# Patient Record
Sex: Female | Born: 1960 | Race: White | Hispanic: No | Marital: Married | Smoking: Never smoker
Health system: Southern US, Community
[De-identification: ages and names within clinical notes are randomized; demographics above are authoritative.]

## PROBLEM LIST (undated history)

## (undated) DIAGNOSIS — I639 Cerebral infarction, unspecified: Secondary | ICD-10-CM

## (undated) DIAGNOSIS — G35 Multiple sclerosis: Secondary | ICD-10-CM

## (undated) HISTORY — PX: TUBAL LIGATION: SHX77

## (undated) HISTORY — PX: TONSILLECTOMY AND ADENOIDECTOMY: SHX28

## (undated) HISTORY — PX: OTHER SURGICAL HISTORY: SHX169

## (undated) HISTORY — PX: CHOLECYSTECTOMY: SHX55

## (undated) HISTORY — PX: PAIN PUMP IMPLANTATION: SHX330

---

## 2007-08-14 ENCOUNTER — Encounter (HOSPITAL_COMMUNITY): Admission: RE | Admit: 2007-08-14 | Discharge: 2007-09-13 | Payer: Self-pay | Admitting: Specialist

## 2007-08-28 ENCOUNTER — Ambulatory Visit (HOSPITAL_COMMUNITY): Admission: RE | Admit: 2007-08-28 | Discharge: 2007-08-28 | Payer: Self-pay | Admitting: Family Medicine

## 2007-09-15 ENCOUNTER — Encounter (HOSPITAL_COMMUNITY): Admission: RE | Admit: 2007-09-15 | Discharge: 2007-10-15 | Payer: Self-pay | Admitting: Specialist

## 2007-10-16 ENCOUNTER — Encounter (HOSPITAL_COMMUNITY): Admission: RE | Admit: 2007-10-16 | Discharge: 2007-11-15 | Payer: Self-pay | Admitting: Specialist

## 2007-11-17 ENCOUNTER — Encounter (HOSPITAL_COMMUNITY): Admission: RE | Admit: 2007-11-17 | Discharge: 2007-12-17 | Payer: Self-pay | Admitting: Specialist

## 2007-12-18 ENCOUNTER — Encounter (HOSPITAL_COMMUNITY): Admission: RE | Admit: 2007-12-18 | Discharge: 2008-01-17 | Payer: Self-pay | Admitting: Specialist

## 2009-05-08 ENCOUNTER — Encounter (HOSPITAL_COMMUNITY): Admission: RE | Admit: 2009-05-08 | Discharge: 2009-06-07 | Payer: Self-pay | Admitting: Specialist

## 2009-06-10 ENCOUNTER — Encounter (HOSPITAL_COMMUNITY): Admission: RE | Admit: 2009-06-10 | Discharge: 2009-07-09 | Payer: Self-pay | Admitting: Specialist

## 2010-07-16 ENCOUNTER — Encounter (HOSPITAL_COMMUNITY)
Admission: RE | Admit: 2010-07-16 | Discharge: 2010-08-11 | Payer: Self-pay | Source: Home / Self Care | Attending: Specialist | Admitting: Specialist

## 2010-08-12 ENCOUNTER — Ambulatory Visit (HOSPITAL_COMMUNITY): Payer: Self-pay | Admitting: Occupational Therapy

## 2010-08-12 ENCOUNTER — Ambulatory Visit (HOSPITAL_COMMUNITY)
Admission: RE | Admit: 2010-08-12 | Discharge: 2010-08-12 | Disposition: A | Discharge status: Home / Self Care | Payer: Medicare Other | Source: Ambulatory Visit | Attending: *Deleted | Admitting: *Deleted

## 2010-08-12 DIAGNOSIS — R262 Difficulty in walking, not elsewhere classified: Secondary | ICD-10-CM | POA: Insufficient documentation

## 2010-08-12 DIAGNOSIS — IMO0001 Reserved for inherently not codable concepts without codable children: Secondary | ICD-10-CM | POA: Insufficient documentation

## 2010-08-12 DIAGNOSIS — M6281 Muscle weakness (generalized): Secondary | ICD-10-CM | POA: Insufficient documentation

## 2010-08-14 ENCOUNTER — Ambulatory Visit (HOSPITAL_COMMUNITY)
Admission: RE | Admit: 2010-08-14 | Discharge: 2010-08-14 | Disposition: A | Discharge status: Home / Self Care | Payer: Medicare Other | Source: Ambulatory Visit | Attending: Specialist | Admitting: Specialist

## 2010-08-14 ENCOUNTER — Ambulatory Visit (HOSPITAL_COMMUNITY): Payer: Self-pay | Admitting: Specialist

## 2010-08-14 ENCOUNTER — Ambulatory Visit (HOSPITAL_COMMUNITY): Payer: Self-pay

## 2010-08-14 DIAGNOSIS — IMO0001 Reserved for inherently not codable concepts without codable children: Secondary | ICD-10-CM | POA: Insufficient documentation

## 2010-08-14 DIAGNOSIS — R262 Difficulty in walking, not elsewhere classified: Secondary | ICD-10-CM | POA: Insufficient documentation

## 2010-08-14 DIAGNOSIS — M6281 Muscle weakness (generalized): Secondary | ICD-10-CM | POA: Insufficient documentation

## 2010-08-17 ENCOUNTER — Ambulatory Visit (HOSPITAL_COMMUNITY): Payer: Self-pay | Admitting: Occupational Therapy

## 2010-08-17 ENCOUNTER — Ambulatory Visit (HOSPITAL_COMMUNITY)
Admission: RE | Admit: 2010-08-17 | Discharge: 2010-08-17 | Disposition: A | Discharge status: Home / Self Care | Payer: Medicare Other | Source: Ambulatory Visit | Attending: *Deleted | Admitting: *Deleted

## 2010-08-17 ENCOUNTER — Ambulatory Visit (HOSPITAL_COMMUNITY): Payer: Medicare Other | Admitting: Physical Therapy

## 2010-08-17 DIAGNOSIS — R209 Unspecified disturbances of skin sensation: Secondary | ICD-10-CM | POA: Insufficient documentation

## 2010-08-17 DIAGNOSIS — R262 Difficulty in walking, not elsewhere classified: Secondary | ICD-10-CM | POA: Insufficient documentation

## 2010-08-17 DIAGNOSIS — IMO0001 Reserved for inherently not codable concepts without codable children: Secondary | ICD-10-CM | POA: Insufficient documentation

## 2010-08-17 DIAGNOSIS — M6281 Muscle weakness (generalized): Secondary | ICD-10-CM | POA: Insufficient documentation

## 2010-08-19 ENCOUNTER — Ambulatory Visit (HOSPITAL_COMMUNITY): Payer: Self-pay | Admitting: Physical Therapy

## 2010-08-20 ENCOUNTER — Ambulatory Visit (HOSPITAL_COMMUNITY)
Admission: RE | Admit: 2010-08-20 | Discharge: 2010-08-20 | Disposition: A | Discharge status: Home / Self Care | Payer: Medicare Other | Source: Ambulatory Visit | Attending: Specialist | Admitting: Specialist

## 2010-08-20 DIAGNOSIS — M6281 Muscle weakness (generalized): Secondary | ICD-10-CM | POA: Insufficient documentation

## 2010-08-20 DIAGNOSIS — R209 Unspecified disturbances of skin sensation: Secondary | ICD-10-CM | POA: Insufficient documentation

## 2010-08-20 DIAGNOSIS — R262 Difficulty in walking, not elsewhere classified: Secondary | ICD-10-CM | POA: Insufficient documentation

## 2010-08-20 DIAGNOSIS — IMO0001 Reserved for inherently not codable concepts without codable children: Secondary | ICD-10-CM | POA: Insufficient documentation

## 2010-08-21 ENCOUNTER — Ambulatory Visit (HOSPITAL_COMMUNITY): Payer: Self-pay

## 2010-08-21 ENCOUNTER — Ambulatory Visit (HOSPITAL_COMMUNITY): Payer: Self-pay | Admitting: Occupational Therapy

## 2010-08-25 ENCOUNTER — Ambulatory Visit (HOSPITAL_COMMUNITY): Payer: Medicare Other | Admitting: Physical Therapy

## 2010-08-25 ENCOUNTER — Ambulatory Visit (HOSPITAL_COMMUNITY)
Admission: RE | Admit: 2010-08-25 | Discharge: 2010-08-25 | Disposition: A | Discharge status: Home / Self Care | Payer: Medicare Other | Source: Ambulatory Visit | Attending: Specialist | Admitting: Specialist

## 2010-08-25 DIAGNOSIS — IMO0001 Reserved for inherently not codable concepts without codable children: Secondary | ICD-10-CM | POA: Insufficient documentation

## 2010-08-25 DIAGNOSIS — R262 Difficulty in walking, not elsewhere classified: Secondary | ICD-10-CM | POA: Insufficient documentation

## 2010-08-25 DIAGNOSIS — M6281 Muscle weakness (generalized): Secondary | ICD-10-CM | POA: Insufficient documentation

## 2010-08-27 ENCOUNTER — Ambulatory Visit (HOSPITAL_COMMUNITY)
Admission: RE | Admit: 2010-08-27 | Discharge: 2010-08-27 | Disposition: A | Discharge status: Home / Self Care | Payer: Medicare Other | Source: Ambulatory Visit

## 2010-08-28 ENCOUNTER — Ambulatory Visit (HOSPITAL_COMMUNITY)
Admission: RE | Admit: 2010-08-28 | Discharge: 2010-08-28 | Disposition: A | Discharge status: Home / Self Care | Payer: Medicare Other | Source: Ambulatory Visit

## 2010-08-31 ENCOUNTER — Ambulatory Visit (HOSPITAL_COMMUNITY)
Admission: RE | Admit: 2010-08-31 | Discharge: 2010-08-31 | Disposition: A | Discharge status: Home / Self Care | Payer: Medicare Other | Source: Ambulatory Visit | Attending: *Deleted | Admitting: *Deleted

## 2010-08-31 ENCOUNTER — Ambulatory Visit (HOSPITAL_COMMUNITY): Payer: Medicare Other | Admitting: Physical Therapy

## 2010-09-02 ENCOUNTER — Ambulatory Visit (HOSPITAL_COMMUNITY)
Admission: RE | Admit: 2010-09-02 | Discharge: 2010-09-02 | Disposition: A | Discharge status: Home / Self Care | Payer: Medicare Other | Source: Ambulatory Visit | Attending: *Deleted | Admitting: *Deleted

## 2010-09-04 ENCOUNTER — Ambulatory Visit (HOSPITAL_COMMUNITY)
Admission: RE | Admit: 2010-09-04 | Discharge: 2010-09-04 | Disposition: A | Discharge status: Home / Self Care | Payer: Medicare Other | Source: Ambulatory Visit

## 2010-09-07 ENCOUNTER — Ambulatory Visit (HOSPITAL_COMMUNITY): Payer: Medicare Other | Admitting: Physical Therapy

## 2010-09-09 ENCOUNTER — Ambulatory Visit (HOSPITAL_COMMUNITY)
Admission: RE | Admit: 2010-09-09 | Discharge: 2010-09-09 | Disposition: A | Discharge status: Home / Self Care | Payer: Medicare Other | Source: Ambulatory Visit | Attending: *Deleted | Admitting: *Deleted

## 2010-09-11 ENCOUNTER — Ambulatory Visit (HOSPITAL_COMMUNITY)
Admission: RE | Admit: 2010-09-11 | Discharge: 2010-09-11 | Disposition: A | Discharge status: Home / Self Care | Payer: Medicare Other | Source: Ambulatory Visit | Attending: Specialist | Admitting: Specialist

## 2010-09-11 DIAGNOSIS — M6281 Muscle weakness (generalized): Secondary | ICD-10-CM | POA: Insufficient documentation

## 2010-09-11 DIAGNOSIS — R262 Difficulty in walking, not elsewhere classified: Secondary | ICD-10-CM | POA: Insufficient documentation

## 2010-09-11 DIAGNOSIS — IMO0001 Reserved for inherently not codable concepts without codable children: Secondary | ICD-10-CM | POA: Insufficient documentation

## 2010-09-14 ENCOUNTER — Ambulatory Visit (HOSPITAL_COMMUNITY)
Admission: RE | Admit: 2010-09-14 | Discharge: 2010-09-14 | Disposition: A | Discharge status: Home / Self Care | Payer: Medicare Other | Source: Ambulatory Visit | Attending: Specialist | Admitting: Specialist

## 2010-09-14 DIAGNOSIS — IMO0001 Reserved for inherently not codable concepts without codable children: Secondary | ICD-10-CM | POA: Insufficient documentation

## 2010-09-14 DIAGNOSIS — M6281 Muscle weakness (generalized): Secondary | ICD-10-CM | POA: Insufficient documentation

## 2010-09-14 DIAGNOSIS — R262 Difficulty in walking, not elsewhere classified: Secondary | ICD-10-CM | POA: Insufficient documentation

## 2010-09-16 ENCOUNTER — Ambulatory Visit (HOSPITAL_COMMUNITY): Payer: Medicare Other | Admitting: Physical Therapy

## 2010-09-18 ENCOUNTER — Ambulatory Visit (HOSPITAL_COMMUNITY)
Admission: RE | Admit: 2010-09-18 | Discharge: 2010-09-18 | Disposition: A | Discharge status: Home / Self Care | Payer: Medicare Other | Source: Ambulatory Visit

## 2010-09-21 ENCOUNTER — Ambulatory Visit (HOSPITAL_COMMUNITY)
Admission: RE | Admit: 2010-09-21 | Discharge: 2010-09-21 | Disposition: A | Discharge status: Home / Self Care | Payer: Medicare Other | Source: Ambulatory Visit | Attending: *Deleted | Admitting: *Deleted

## 2010-09-23 ENCOUNTER — Ambulatory Visit (HOSPITAL_COMMUNITY)
Admission: RE | Admit: 2010-09-23 | Discharge: 2010-09-23 | Disposition: A | Discharge status: Home / Self Care | Payer: Medicare Other | Source: Ambulatory Visit | Attending: *Deleted | Admitting: *Deleted

## 2010-09-24 ENCOUNTER — Ambulatory Visit (HOSPITAL_COMMUNITY)
Admission: RE | Admit: 2010-09-24 | Discharge: 2010-09-24 | Disposition: A | Discharge status: Home / Self Care | Payer: Medicare Other | Source: Ambulatory Visit | Attending: *Deleted | Admitting: *Deleted

## 2010-09-28 ENCOUNTER — Ambulatory Visit (HOSPITAL_COMMUNITY)
Admission: RE | Admit: 2010-09-28 | Discharge: 2010-09-28 | Disposition: A | Discharge status: Home / Self Care | Payer: Medicare Other | Source: Ambulatory Visit | Attending: *Deleted | Admitting: *Deleted

## 2010-09-30 ENCOUNTER — Ambulatory Visit (HOSPITAL_COMMUNITY)
Admission: RE | Admit: 2010-09-30 | Discharge: 2010-09-30 | Disposition: A | Discharge status: Home / Self Care | Payer: Medicare Other | Source: Ambulatory Visit | Attending: *Deleted | Admitting: *Deleted

## 2010-10-02 ENCOUNTER — Ambulatory Visit (HOSPITAL_COMMUNITY)
Admission: RE | Admit: 2010-10-02 | Discharge: 2010-10-02 | Disposition: A | Discharge status: Home / Self Care | Payer: Medicare Other | Source: Ambulatory Visit

## 2010-10-05 ENCOUNTER — Ambulatory Visit (HOSPITAL_COMMUNITY)
Admission: RE | Admit: 2010-10-05 | Discharge: 2010-10-05 | Disposition: A | Discharge status: Home / Self Care | Payer: Medicare Other | Source: Ambulatory Visit | Attending: *Deleted | Admitting: *Deleted

## 2010-10-07 ENCOUNTER — Ambulatory Visit (HOSPITAL_COMMUNITY)
Admission: RE | Admit: 2010-10-07 | Discharge: 2010-10-07 | Disposition: A | Discharge status: Home / Self Care | Payer: Medicare Other | Source: Ambulatory Visit | Attending: *Deleted | Admitting: *Deleted

## 2010-10-09 ENCOUNTER — Ambulatory Visit (HOSPITAL_COMMUNITY): Payer: Medicare Other | Admitting: Physical Therapy

## 2010-10-13 ENCOUNTER — Ambulatory Visit (HOSPITAL_COMMUNITY)
Admission: RE | Admit: 2010-10-13 | Discharge: 2010-10-13 | Disposition: A | Discharge status: Home / Self Care | Payer: Medicare Other | Source: Ambulatory Visit | Attending: Specialist | Admitting: Specialist

## 2010-10-15 ENCOUNTER — Ambulatory Visit (HOSPITAL_COMMUNITY)
Admission: RE | Admit: 2010-10-15 | Discharge: 2010-10-15 | Disposition: A | Discharge status: Home / Self Care | Payer: Medicare Other | Source: Ambulatory Visit | Attending: Specialist | Admitting: Specialist

## 2010-10-15 DIAGNOSIS — R262 Difficulty in walking, not elsewhere classified: Secondary | ICD-10-CM | POA: Insufficient documentation

## 2010-10-15 DIAGNOSIS — IMO0001 Reserved for inherently not codable concepts without codable children: Secondary | ICD-10-CM | POA: Insufficient documentation

## 2010-10-15 DIAGNOSIS — M6281 Muscle weakness (generalized): Secondary | ICD-10-CM | POA: Insufficient documentation

## 2010-10-20 ENCOUNTER — Ambulatory Visit (HOSPITAL_COMMUNITY)
Admission: RE | Admit: 2010-10-20 | Discharge: 2010-10-20 | Disposition: A | Discharge status: Home / Self Care | Payer: Medicare Other | Source: Ambulatory Visit | Attending: *Deleted | Admitting: *Deleted

## 2010-10-22 ENCOUNTER — Ambulatory Visit (HOSPITAL_COMMUNITY)
Admission: RE | Admit: 2010-10-22 | Discharge: 2010-10-22 | Disposition: A | Discharge status: Home / Self Care | Payer: Medicare Other | Source: Ambulatory Visit | Attending: *Deleted | Admitting: *Deleted

## 2010-10-27 ENCOUNTER — Ambulatory Visit (HOSPITAL_COMMUNITY): Payer: Medicare Other

## 2010-10-29 ENCOUNTER — Ambulatory Visit (HOSPITAL_COMMUNITY)
Admission: RE | Admit: 2010-10-29 | Discharge: 2010-10-29 | Disposition: A | Discharge status: Home / Self Care | Payer: Medicare Other | Source: Ambulatory Visit | Attending: *Deleted | Admitting: *Deleted

## 2010-11-03 ENCOUNTER — Ambulatory Visit (HOSPITAL_COMMUNITY)
Admission: RE | Admit: 2010-11-03 | Discharge: 2010-11-03 | Disposition: A | Discharge status: Home / Self Care | Payer: Medicare Other | Source: Ambulatory Visit | Attending: *Deleted | Admitting: *Deleted

## 2010-11-05 ENCOUNTER — Ambulatory Visit (HOSPITAL_COMMUNITY)
Admission: RE | Admit: 2010-11-05 | Discharge: 2010-11-05 | Disposition: A | Discharge status: Home / Self Care | Payer: Medicare Other | Source: Ambulatory Visit | Attending: *Deleted | Admitting: *Deleted

## 2011-01-01 ENCOUNTER — Ambulatory Visit (HOSPITAL_BASED_OUTPATIENT_CLINIC_OR_DEPARTMENT_OTHER)
Admission: RE | Admit: 2011-01-01 | Discharge: 2011-01-01 | Disposition: A | Discharge status: Home / Self Care | Payer: Medicare Other | Source: Ambulatory Visit | Attending: Orthopedic Surgery | Admitting: Orthopedic Surgery

## 2011-01-01 DIAGNOSIS — Q6600 Congenital talipes equinovarus, unspecified foot: Secondary | ICD-10-CM | POA: Insufficient documentation

## 2011-01-01 DIAGNOSIS — L97509 Non-pressure chronic ulcer of other part of unspecified foot with unspecified severity: Secondary | ICD-10-CM | POA: Insufficient documentation

## 2011-01-01 DIAGNOSIS — M6240 Contracture of muscle, unspecified site: Secondary | ICD-10-CM | POA: Insufficient documentation

## 2011-01-12 NOTE — Op Note (Signed)
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  PROCEDURES: 1. Percutaneous tendo Achilles lengthening. 2. Z-lengthening of posterior tibial tendon. 3. Deep transfer of the tibialis anterior to the lateral cuneiform. 4. Intraoperative interpretation of fluoroscopic imaging.  SURGEON:  Toni Arthurs, MD  ANESTHESIA:  General.  INTRAVENOUS FLUIDS:  See anesthesia record.  ESTIMATED BLOOD LOSS:  Minimal.  TOURNIQUET TIME:  One hour and 19 minutes at 250 mmHg.  COMPLICATIONS:  None apparent.  DISPOSITION:  Extubated, awake, and stable to recovery.  INDICATIONS FOR PROCEDURE:  The patient is a 50 year old woman with past medical history significant for multiple sclerosis and a stroke affecting the left lower extremity.  The left foot has become troublesome due to an equinovarus foot deformity.  This has led to recurrent fifth metatarsal head ulcers.  She presents now for correction of this foot deformity in an effort to gain a plantigrade foot that is resistant to ulceration and is shoeable for transfers.  She understands the risks and benefits of this procedure as well as the alternative treatment options.  Specifically, she understands the risks of bleeding, infection, nerve damage, blood clot, the need for additional surgery, recurrent deformity, amputation, and death.  She elects to proceed.  PROCEDURE IN DETAIL:  After preoperative consent was obtained and the correct  operative site was identified, the patient was brought to the operating room and placed supine on the operating table.  General anesthesia was induced.  Preoperative antibiotics were administered.  A surgical time-out was taken.  The left lower extremity was prepped and draped in a standard sterile fashion with tourniquet around the thigh. The patient's Achilles tendon was then lengthened percutaneously with a triple hemisection technique.  This allowed passive dorsiflexion with the knee extended of about 10 degrees.  Longitudinal incisions were then marked on the medial aspect of the foot at the insertion of the tibialis anterior along the medial aspect of the leg overlying the posterior tibial tendon sheath and on the dorsum of the foot overlying the third metatarsal base and lateral cuneiform.  Extremity was exsanguinated and tourniquet was inflated to 250 mmHg.  The posterior tibial tendon incision was made and sharp dissection was carried down through the skin.  Blunt dissection was carried down to the superficial aspect of the sheath.  The sheath was opened in line with its fibers.  Posterior tendon was isolated.  It was lengthened by making a Z-shaped tenotomy. The foot was then positioned in neutral and the tendon was stitched back in this new resting length.  3-0 Ethibond horizontal mattress sutures were used to repair the tendon.  The wound was irrigated copiously. Inverted simple sutures of  4-0 Monocryl were used to close the subcutaneous tissue.  The skin was closed with a running 3-0 Prolene. Attention was then turned to the midfoot where the previously marked incision was made.  Sharp dissection was carried down through the skin. Blunt dissection was carried down through the subcutaneous tissue to the insertion of the tibialis anterior.  The T-band was isolated and was dissected subperiosteally, medially and then plantarly on the medial cuneiform to release it with the  maximum length.  A second incision was then made on the anterior aspect of the leg about 8 cm proximal from the ankle joint.  Sharp dissection was carried down to the extensor tendon sheath and the sheath was incised.  The tibialis anterior tendon was isolated.  The T-band was then withdrawn in its entirety from beneath the retinaculum deep to the proximal incision and then brought out the wound.  The tendon end was then stitched with a #2 FiberWire using the fiber loop technique.  The dorsolateral incision was made and sharp dissection was carried down through the skin.  Blunt dissection was carried down to the extensor retinaculum.  The retinaculum was incised and the long toe extensor tendons were retracted out of the way.  The extensor digitorum brevis was retracted proximally.  The lateral cuneiform was exposed.  A guidepin was inserted centrally in the distal half of the lateral cuneiform.  Appropriate position of this guidewire was verified on fluoroscopic images.  The tibialis anterior was measured at 5 mm.  A 5.5-mm reamer was selected and drilled down through the full depth of the lateral cuneiform.  A 5.5-mm x 15-mm screw was selected. The beef pin was inserted down through the hole and passed off the plantar surface of the foot.  The suture ends were passed through the eye of the pin and pulled down through the sole of the foot with the pin.  The ankle was reduced in neutral.  The tibialis anterior tendon was then pulled down into the previously drilled hole and tensioned appropriately.  A 5.5-mm x 15-mm Arthrex bio-tenodesis screw was then inserted into the hole.  It was noted to have adequate purchase.  The ankle was then released and there was no loss of fixation noted at the tibialis anterior tendon.  Another x-ray was obtained which showed that the medial cortex of the lateral cuneiform was bulging.  There was no clear fracture line.  The decision was made at that time to  place a second anchor for backup fixation.  A 3.5-mm titanium corkscrew anchor was then inserted centrally in the proximal half of the lateral cuneiform.  Position was verified under fluoroscopy prior to inserting the anchor.  The anchor was inserted and was noted to have excellent purchase.  The two #2 FiberWire sutures were passed into the tibialis anterior tendon in a horizontal mattress fashion.  They were both tied down.  The wound was irrigated copiously.  Inverted simple sutures of 4- 0 Monocryl were used to close the subcutaneous tissue.  A running 3-0 Prolene suture was used to close the skin incision.  The remaining wounds were irrigated and closed in the same fashion.  Sterile dressings were applied followed by well-padded splint with the ankle held in neutral position.  The tourniquet was released at 1 hour and 19 minutes after application of the splint.  The patient was awakened by anesthesia and transported to the recovery room in stable condition.  FOLLOWUP PLAN:  The patient will be nonweightbearing on  the left lower extremity.  She will follow up with me in 2 weeks for suture removal and conversion to a walking cast.     Toni Arthurs, MD     JH/MEDQ  D:  01/01/2011  T:  01/02/2011  Job:  [id number]  Electronically Signed by Jonny Ruiz Montavis Schubring  on 01/12/2011 05:05:42 PM

## 2013-06-18 ENCOUNTER — Other Ambulatory Visit (HOSPITAL_COMMUNITY): Payer: Self-pay | Admitting: Specialist

## 2013-06-18 DIAGNOSIS — R200 Anesthesia of skin: Secondary | ICD-10-CM

## 2013-06-20 ENCOUNTER — Ambulatory Visit (HOSPITAL_COMMUNITY)
Admission: RE | Admit: 2013-06-20 | Discharge: 2013-06-20 | Disposition: A | Discharge status: Home / Self Care | Payer: Medicare Other | Source: Ambulatory Visit | Attending: Specialist | Admitting: Specialist

## 2013-06-20 DIAGNOSIS — R200 Anesthesia of skin: Secondary | ICD-10-CM

## 2013-06-20 DIAGNOSIS — G35 Multiple sclerosis: Secondary | ICD-10-CM | POA: Insufficient documentation

## 2013-06-20 DIAGNOSIS — R209 Unspecified disturbances of skin sensation: Secondary | ICD-10-CM | POA: Insufficient documentation

## 2013-06-20 MED ORDER — IOHEXOL 300 MG/ML  SOLN
75.0000 mL | Freq: Once | INTRAMUSCULAR | Status: AC | PRN
  Administered 2013-06-20: 75 mL via INTRAVENOUS

## 2014-03-02 ENCOUNTER — Emergency Department (HOSPITAL_COMMUNITY): Payer: Medicare Other

## 2014-03-02 ENCOUNTER — Encounter (HOSPITAL_COMMUNITY): Payer: Self-pay | Admitting: Family Medicine

## 2014-03-02 ENCOUNTER — Observation Stay (HOSPITAL_COMMUNITY)
Admission: EM | Admit: 2014-03-02 | Discharge: 2014-03-03 | Disposition: A | Discharge status: Home / Self Care | Payer: Medicare Other | Attending: Family Medicine | Admitting: Family Medicine

## 2014-03-02 DIAGNOSIS — Z8673 Personal history of transient ischemic attack (TIA), and cerebral infarction without residual deficits: Secondary | ICD-10-CM | POA: Insufficient documentation

## 2014-03-02 DIAGNOSIS — N390 Urinary tract infection, site not specified: Secondary | ICD-10-CM | POA: Diagnosis not present

## 2014-03-02 DIAGNOSIS — G4730 Sleep apnea, unspecified: Secondary | ICD-10-CM

## 2014-03-02 DIAGNOSIS — G35 Multiple sclerosis: Secondary | ICD-10-CM | POA: Diagnosis not present

## 2014-03-02 DIAGNOSIS — F29 Unspecified psychosis not due to a substance or known physiological condition: Secondary | ICD-10-CM | POA: Insufficient documentation

## 2014-03-02 DIAGNOSIS — Z79899 Other long term (current) drug therapy: Secondary | ICD-10-CM | POA: Diagnosis not present

## 2014-03-02 DIAGNOSIS — G478 Other sleep disorders: Secondary | ICD-10-CM

## 2014-03-02 DIAGNOSIS — G9340 Encephalopathy, unspecified: Secondary | ICD-10-CM

## 2014-03-02 DIAGNOSIS — Z7902 Long term (current) use of antithrombotics/antiplatelets: Secondary | ICD-10-CM | POA: Diagnosis not present

## 2014-03-02 DIAGNOSIS — R4182 Altered mental status, unspecified: Secondary | ICD-10-CM | POA: Diagnosis present

## 2014-03-02 HISTORY — DX: Multiple sclerosis: G35

## 2014-03-02 HISTORY — DX: Cerebral infarction, unspecified: I63.9

## 2014-03-02 LAB — URINALYSIS, ROUTINE W REFLEX MICROSCOPIC
Bilirubin Urine: NEGATIVE
Glucose, UA: NEGATIVE mg/dL
HGB URINE DIPSTICK: NEGATIVE
Ketones, ur: NEGATIVE mg/dL
NITRITE: POSITIVE — AB
Protein, ur: NEGATIVE mg/dL
SPECIFIC GRAVITY, URINE: 1.015 (ref 1.005–1.030)
UROBILINOGEN UA: 0.2 mg/dL (ref 0.0–1.0)
pH: 7.5 (ref 5.0–8.0)

## 2014-03-02 LAB — CBC WITH DIFFERENTIAL/PLATELET
BASOS ABS: 0 10*3/uL (ref 0.0–0.1)
BASOS PCT: 0 % (ref 0–1)
Eosinophils Absolute: 0.1 10*3/uL (ref 0.0–0.7)
Eosinophils Relative: 2 % (ref 0–5)
HCT: 38.2 % (ref 36.0–46.0)
HEMOGLOBIN: 12.3 g/dL (ref 12.0–15.0)
Lymphocytes Relative: 12 % (ref 12–46)
Lymphs Abs: 1 10*3/uL (ref 0.7–4.0)
MCH: 28.1 pg (ref 26.0–34.0)
MCHC: 32.2 g/dL (ref 30.0–36.0)
MCV: 87.4 fL (ref 78.0–100.0)
MONOS PCT: 8 % (ref 3–12)
Monocytes Absolute: 0.6 10*3/uL (ref 0.1–1.0)
NEUTROS ABS: 6.1 10*3/uL (ref 1.7–7.7)
NEUTROS PCT: 78 % — AB (ref 43–77)
PLATELETS: 261 10*3/uL (ref 150–400)
RBC: 4.37 MIL/uL (ref 3.87–5.11)
RDW: 13.4 % (ref 11.5–15.5)
WBC: 7.9 10*3/uL (ref 4.0–10.5)

## 2014-03-02 LAB — COMPREHENSIVE METABOLIC PANEL
ALBUMIN: 3.3 g/dL — AB (ref 3.5–5.2)
ALK PHOS: 114 U/L (ref 39–117)
ALT: 36 U/L — AB (ref 0–35)
AST: 34 U/L (ref 0–37)
Anion gap: 10 (ref 5–15)
BUN: 14 mg/dL (ref 6–23)
CALCIUM: 9 mg/dL (ref 8.4–10.5)
CO2: 30 mEq/L (ref 19–32)
Chloride: 102 mEq/L (ref 96–112)
Creatinine, Ser: 0.59 mg/dL (ref 0.50–1.10)
GFR calc Af Amer: >90 mL/min (ref >90)
GFR calc non Af Amer: >90 mL/min (ref >90)
Glucose, Bld: 117 mg/dL — ABNORMAL HIGH (ref 70–99)
POTASSIUM: 4.5 meq/L (ref 3.7–5.3)
SODIUM: 142 meq/L (ref 137–147)
TOTAL PROTEIN: 7 g/dL (ref 6.0–8.3)
Total Bilirubin: 0.2 mg/dL — ABNORMAL LOW (ref 0.3–1.2)

## 2014-03-02 LAB — URINE MICROSCOPIC-ADD ON

## 2014-03-02 LAB — RAPID URINE DRUG SCREEN, HOSP PERFORMED
AMPHETAMINES: NOT DETECTED
BARBITURATES: NOT DETECTED
BENZODIAZEPINES: NOT DETECTED
COCAINE: NOT DETECTED
Opiates: NOT DETECTED
Tetrahydrocannabinol: NOT DETECTED

## 2014-03-02 LAB — TROPONIN I

## 2014-03-02 MED ORDER — SODIUM CHLORIDE 0.9 % IV SOLN: 250.0000 mL | INTRAVENOUS | Status: DC | PRN

## 2014-03-02 MED ORDER — ZOLPIDEM TARTRATE 5 MG PO TABS
5.0000 mg | ORAL_TABLET | Freq: Every day | ORAL | Status: DC
  Administered 2014-03-02: 5 mg via ORAL
  Filled 2014-03-02: qty 1

## 2014-03-02 MED ORDER — ACETAMINOPHEN 650 MG RE SUPP: 650.0000 mg | Freq: Four times a day (QID) | RECTAL | Status: DC | PRN

## 2014-03-02 MED ORDER — SODIUM CHLORIDE 0.9 % IJ SOLN
3.0000 mL | Freq: Two times a day (BID) | INTRAMUSCULAR | Status: DC
  Administered 2014-03-02 – 2014-03-03 (×2): 3 mL via INTRAVENOUS

## 2014-03-02 MED ORDER — ATORVASTATIN CALCIUM 10 MG PO TABS
10.0000 mg | ORAL_TABLET | Freq: Every day | ORAL | Status: DC
  Administered 2014-03-02: 10 mg via ORAL
  Filled 2014-03-02: qty 1

## 2014-03-02 MED ORDER — CEFTRIAXONE SODIUM 1 G IJ SOLR
1.0000 g | INTRAMUSCULAR | Status: DC
  Administered 2014-03-03: 1 g via INTRAVENOUS
  Filled 2014-03-02 (×2): qty 10

## 2014-03-02 MED ORDER — CLOPIDOGREL BISULFATE 75 MG PO TABS
75.0000 mg | ORAL_TABLET | Freq: Every day | ORAL | Status: DC
  Administered 2014-03-02 – 2014-03-03 (×2): 75 mg via ORAL
  Filled 2014-03-02 (×2): qty 1

## 2014-03-02 MED ORDER — DEXTROSE 5 % IV SOLN
1.0000 g | Freq: Once | INTRAVENOUS | Status: AC
  Administered 2014-03-02: 1 g via INTRAVENOUS
  Filled 2014-03-02: qty 10

## 2014-03-02 MED ORDER — SODIUM CHLORIDE 0.9 % IJ SOLN: 3.0000 mL | INTRAMUSCULAR | Status: DC | PRN

## 2014-03-02 MED ORDER — ONDANSETRON HCL 4 MG PO TABS: 4.0000 mg | ORAL_TABLET | Freq: Four times a day (QID) | ORAL | Status: DC | PRN

## 2014-03-02 MED ORDER — SODIUM CHLORIDE 0.9 % IJ SOLN: 3.0000 mL | Freq: Two times a day (BID) | INTRAMUSCULAR | Status: DC

## 2014-03-02 MED ORDER — FLUOXETINE HCL 20 MG PO CAPS
40.0000 mg | ORAL_CAPSULE | Freq: Two times a day (BID) | ORAL | Status: DC
  Administered 2014-03-02 – 2014-03-03 (×3): 40 mg via ORAL
  Filled 2014-03-02 (×3): qty 2

## 2014-03-02 MED ORDER — ACETAMINOPHEN 325 MG PO TABS
650.0000 mg | ORAL_TABLET | Freq: Four times a day (QID) | ORAL | Status: DC | PRN
Start: 2014-03-02 — End: 2014-03-03
  Administered 2014-03-02: 650 mg via ORAL
  Filled 2014-03-02: qty 2

## 2014-03-02 MED ORDER — METHYLPHENIDATE HCL 5 MG PO TABS
10.0000 mg | ORAL_TABLET | Freq: Two times a day (BID) | ORAL | Status: DC
  Administered 2014-03-03 (×2): 10 mg via ORAL
  Filled 2014-03-02 (×3): qty 2

## 2014-03-02 MED ORDER — ONDANSETRON HCL 4 MG/2ML IJ SOLN: 4.0000 mg | Freq: Four times a day (QID) | INTRAMUSCULAR | Status: DC | PRN

## 2014-03-02 MED ORDER — ENOXAPARIN SODIUM 40 MG/0.4ML ~~LOC~~ SOLN
40.0000 mg | Freq: Every day | SUBCUTANEOUS | Status: DC
  Administered 2014-03-02 – 2014-03-03 (×2): 40 mg via SUBCUTANEOUS
  Filled 2014-03-02 (×2): qty 0.4

## 2014-03-02 MED ORDER — PILOCARPINE HCL 5 MG PO TABS
5.0000 mg | ORAL_TABLET | Freq: Every day | ORAL | Status: DC
  Administered 2014-03-02 – 2014-03-03 (×2): 5 mg via ORAL
  Filled 2014-03-02 (×3): qty 1

## 2014-03-02 NOTE — H&P (Signed)
History and Physical  Laurie Collier ZOX:[id number] DOB: 24-Feb-1961 DOA: 03/02/2014  Referring physician: Dr. Benjiman Core in ED PCP: Colette Ribas, MD   Chief Complaint: unresponsive  HPI:  53 year old woman with h/o MS, stroke presented to ED after period of unresponsiveness and hypopnea this AM. Initial evaluation was notable only for possible UTI.  History per patient and husband at bedside. Has been in usual state of health lately. At baseline wheelchair bound has residual facial, LUE,LLE weakness.  This AM husband noted patient was snoring loudly (unusual) and he was unable to awaken despite vigorous attempts for 10 minutes, therefore called EMS. No seizure activity, no focal deficits noted. After EMS arrival patient awoke but become hypopneic and consideration was given to intubation, but patient suddenly awoke with the closing of the ambulance door and since then has been awake with normal respirations.   No CP, SOB or recent illness. No pain now, no complaints. No new medications, no recent PO baclofen use. No apparent history of OSA. Currently slightly "fuzzy" but close to baseline. Neuro deficits at baseline per husband.  In the emergency department afebrile, VSS, no hypoxia. Exam per EDP without new focal deficits. CMP, troponin, CBC, UDS unremarkable. U/A equivocal. CXR independently reviewed, low lung volumes, NAD. EKG independently reviewed: ST, no acute changes. CT head unremarkable.  Review of Systems:  Negative for fever, new visual changes, sore throat, rash, new muscle aches, chest pain, SOB, dysuria, bleeding, n/v/abdominal pain.  Past Medical History  Diagnosis Date  . Multiple sclerosis   . Stroke     2008 with residual LUE/LLE weakness and visual disturbance    Past Surgical History  Procedure Laterality Date  . Pain pump implantation    . Bladder pacemaker    . Cholecystectomy    . Cesarean section      Social History:  reports that she has  never smoked. She does not have any smokeless tobacco history on file. She reports that she does not drink alcohol or use illicit drugs.  Allergies  Allergen Reactions  . Sulfa Antibiotics     Reaction unknown    Family History  Problem Relation Age of Onset  . Cancer Father   . Diabetes Mother      Prior to Admission medications   Medication Sig Start Date End Date Taking? Authorizing Provider  atorvastatin (LIPITOR) 10 MG tablet Take 10 mg by mouth at bedtime.   Yes Historical Provider, MD  baclofen (LIORESAL) 10 MG tablet Take 10 mg by mouth 3 (three) times daily as needed (uses if the pump isn't working or she becomes stiff).   Yes Historical Provider, MD  BACLOFEN IT by Intrathecal route continuous. Baclofen pump   Yes Historical Provider, MD  clopidogrel (PLAVIX) 75 MG tablet Take 75 mg by mouth daily.   Yes Historical Provider, MD  FLUoxetine (PROZAC) 40 MG capsule Take 40 mg by mouth 2 (two) times daily.   Yes Historical Provider, MD  methylphenidate (RITALIN) 10 MG tablet Take 10 mg by mouth 2 (two) times daily with breakfast and lunch.   Yes Historical Provider, MD  Multiple Vitamin (MULTIVITAMIN WITH MINERALS) TABS tablet Take 1 tablet by mouth daily.   Yes Historical Provider, MD  nitrofurantoin, macrocrystal-monohydrate, (MACROBID) 100 MG capsule Take 100 mg by mouth daily.   Yes Historical Provider, MD  pilocarpine (SALAGEN) 5 MG tablet Take 5 mg by mouth daily.   Yes Historical Provider, MD  zolpidem (AMBIEN) 10 MG tablet Take 10 mg  by mouth at bedtime.   Yes Historical Provider, MD   Physical Exam: Filed Vitals:   03/02/14 0930 03/02/14 0948 03/02/14 1000 03/02/14 1148  BP: 130/62 130/62 134/69 117/54  Pulse: 85 86 84 85  Temp:    98.6 F (37 C)  TempSrc:    Oral  Resp: 15 23 27 23   Height:      Weight:      SpO2: 95% 93% 96% 99%   General: Examined in ED. Appears calm and comfortable Eyes: PERRL, normal lids, irises  ENT: grossly normal hearing, lips &  tongue Neck: no LAD, masses or thyromegaly Cardiovascular: RRR, no m/r/g. No LE edema. Respiratory: CTA bilaterally, no w/r/r. Normal respiratory effort. Abdomen: soft, ntnd Skin: no rash or induration seen  Musculoskeletal: grossly normal tone BUE/BLE. Strength RUE/RLE 5/5. LUE/LLE 4/5. Able to lift both arms and legs off the bed. Psychiatric: somewhat slow to respond, but answers questions appropriately. Oriented to self, location, time of year, president Neurologic: mild LUE/LLE weakness, facial weakness noted, at baseline per husband.  Wt Readings from Last 3 Encounters:  03/02/14 63.504 kg (140 lb)    Labs on Admission:  Basic Metabolic Panel:  Recent Labs Lab 03/02/14 0756  NA 142  K 4.5  CL 102  CO2 30  GLUCOSE 117*  BUN 14  CREATININE 0.59  CALCIUM 9.0    Liver Function Tests:  Recent Labs Lab 03/02/14 0756  AST 34  ALT 36*  ALKPHOS 114  BILITOT 0.2*  PROT 7.0  ALBUMIN 3.3*   CBC:  Recent Labs Lab 03/02/14 0755  WBC 7.9  NEUTROABS 6.1  HGB 12.3  HCT 38.2  MCV 87.4  PLT 261    Cardiac Enzymes:  Recent Labs Lab 03/02/14 0756  TROPONINI <0.30    Radiological Exams on Admission: Dg Chest 1 View  03/02/2014   CLINICAL DATA:  Altered mental status  EXAM: CHEST - 1 VIEW  COMPARISON:  None.  FINDINGS: Low lung volumes with vascular congestion and basilar atelectasis. Normal heart size. No edema, effusion or pneumothorax. Prior cholecystectomy noted. Monitor leads overlie the chest. Trachea midline.  IMPRESSION: Low volume exam with basilar atelectasis.   Electronically Signed   By: Ruel Favorsrevor  Shick M.D.   On: 03/02/2014 08:45   Ct Head Wo Contrast  03/02/2014   CLINICAL DATA:  Altered mental status  EXAM: CT HEAD WITHOUT CONTRAST  TECHNIQUE: Contiguous axial images were obtained from the base of the skull through the vertex without contrast.  COMPARISON:  06/20/2013  FINDINGS: Stable mild atrophy pattern and periventricular white matter changes. Stable  slight asymmetry in the lateral ventricles, larger on the right. Suspect a component of chronic ex vacuo dilatation. No acute intracranial hemorrhage, new infarction, mass lesion, midline shift, herniation, or extra-axial fluid collection. Cisterns patent. No cerebellar abnormality. Orbits are normal. Mastoids and sinuses clear.  IMPRESSION: Stable atrophy and chronic white matter changes. No acute or new process by noncontrast CT.   Electronically Signed   By: Ruel Favorsrevor  Shick M.D.   On: 03/02/2014 09:09     Principal Problem:   UTI (lower urinary tract infection) Active Problems:   Acute encephalopathy   Sleep hypopnea   Assessment/Plan 1. Acute encephalopathy, nearly resolved. Etiology unclear, no s/s of infection but consider UTI. No neuro changes, no history to suggest ACS or CNS event. 2. Hypopnea. Resolved, no hypoxia, no chest pain, no SOB. Consider OSA? Consider outpatient sleep study. No evidence of acute pulm process. 3. Possible UTI, on  chronic suppressive therapy with Macrobid, self-cath multiple times per day 4. H/o MS, stroke with residual LUE/LLE weakness   Appears stable for admission to telemetry. Follow clinically, treat possible UTI.  Monitor for hypoxia, hypopnea.  Discussed with husband and sister at bedside  Possibly home 8/23 if improved  Code Status: full code  DVT prophylaxis: Lovenox Family Communication:  Disposition Plan/Anticipated LOS: obs, 24 hours  Time spent: 60 minutes  Brendia Sacks, MD  [facility] Pager [phone] 03/02/2014, 12:17 PM

## 2014-03-02 NOTE — ED Notes (Signed)
EMS reports pt was unresponsive upon their arrival.  Reports went into respiratory arrest and while preparing to intubate, pt became arousable.  Pt alert, talking answering questions but confused.  EMS reports husband says pt was snoring "really bad" last night and was not normal for her.  Pt denies pain, c/o thirst.

## 2014-03-02 NOTE — ED Notes (Signed)
Pt states she self caths.  In and out cath performed using sterile technique.  Cloudy yellow urine.

## 2014-03-02 NOTE — ED Notes (Signed)
EMS reports cbg 107

## 2014-03-02 NOTE — Progress Notes (Signed)
Pt unable to self cath due to weakness and using bedside commode.  Pt attempted to self cath x 3 but unable to and pt would not let staff help her.  Per verbal discussion with MD if unable to self cath insert indwelling catheter.

## 2014-03-02 NOTE — ED Provider Notes (Signed)
CSN: [id number]     Arrival date & time 03/02/14  7829 History  This chart was scribed for Laurie Collier Payor, MD by Jarvis Morgan, ED Scribe. This patient was seen in room APA01/APA01 and the patient's care was started at 7:50 AM.    Chief Complaint  Patient presents with  . Altered Mental Status   Level 5 Caveat- AMS  The history is provided by the spouse, the patient and the EMS personnel. The history is limited by the condition of the patient. No language interpreter was used.   HPI Comments: Laurie Collier is a 53 y.o. female with a h/o CVA and MS brought in by ambulance, who presents to the Emergency Department due to an AMS. Per EMS pt was unresponsive upon their arrival. They state she went intro respiratory arrest and while preparing to intubate she became arousable.. Husband states yesterday the pt was acting normal but she was snoring bed which has never happened. He reports she has relapsing and remitting MS and had a stroke. In wheelchair all the time. Does not walk. Left sided weakness at baseline due to MS and CVA history.  Husband denies any change in medications. Husband states he found her and she was unresponsive so he called EMS. No pain or weakness, blurry vision, or HA.   Past Medical History  Diagnosis Date  . Multiple sclerosis   . Stroke     2008 with residual LUE/LLE weakness and visual disturbance   Past Surgical History  Procedure Laterality Date  . Pain pump implantation    . Bladder pacemaker    . Cholecystectomy    . Cesarean section     Family History  Problem Relation Age of Onset  . Cancer Father   . Diabetes Mother    History  Substance Use Topics  . Smoking status: Never Smoker   . Smokeless tobacco: Not on file  . Alcohol Use: No   OB History   Grav Para Term Preterm Abortions TAB SAB Ect Mult Living                 Review of Systems  Unable to perform ROS Cardiovascular: Negative for chest pain.  Neurological: Negative for  headaches.  Psychiatric/Behavioral: Positive for confusion.  All other systems reviewed and are negative.     Allergies  Sulfa antibiotics  Home Medications   Prior to Admission medications   Medication Sig Start Date End Date Taking? Authorizing Provider  atorvastatin (LIPITOR) 10 MG tablet Take 10 mg by mouth at bedtime.   Yes Historical Provider, MD  baclofen (LIORESAL) 10 MG tablet Take 10 mg by mouth 3 (three) times daily as needed (uses if the pump isn't working or she becomes stiff).   Yes Historical Provider, MD  BACLOFEN IT by Intrathecal route continuous. Baclofen pump   Yes Historical Provider, MD  clopidogrel (PLAVIX) 75 MG tablet Take 75 mg by mouth daily.   Yes Historical Provider, MD  FLUoxetine (PROZAC) 40 MG capsule Take 40 mg by mouth 2 (two) times daily.   Yes Historical Provider, MD  methylphenidate (RITALIN) 10 MG tablet Take 10 mg by mouth 2 (two) times daily with breakfast and lunch.   Yes Historical Provider, MD  Multiple Vitamin (MULTIVITAMIN WITH MINERALS) TABS tablet Take 1 tablet by mouth daily.   Yes Historical Provider, MD  pilocarpine (SALAGEN) 5 MG tablet Take 5 mg by mouth daily.   Yes Historical Provider, MD  zolpidem (AMBIEN) 10 MG tablet Take  10 mg by mouth at bedtime.   Yes Historical Provider, MD  cefUROXime (CEFTIN) 500 MG tablet Take 1 tablet (500 mg total) by mouth 2 (two) times daily with a meal. Start 8/24 in the morning. 03/04/14   Standley Brooking, MD  nitrofurantoin, macrocrystal-monohydrate, (MACROBID) 100 MG capsule Take 1 capsule (100 mg total) by mouth daily. Hold this medication until you have completed Ceftin. Then resume nitrofurantoin. 03/03/14   Standley Brooking, MD   Triage Vitals: BP 134/71  Pulse 102  Temp(Src) 98.4 F (36.9 C) (Oral)  Resp 18  Ht 5\' 4"  (1.626 m)  Wt 140 lb (63.504 kg)  BMI 24.02 kg/m2  SpO2 91%  Physical Exam  Nursing note and vitals reviewed. Constitutional: She is oriented to person, place, and time.  She appears well-developed and well-nourished. No distress.  Fixation on thirst. NAD. Slow to answer questions. Slow to identify family.  HENT:  Head: Normocephalic and atraumatic.  Eyes: Conjunctivae and EOM are normal. Pupils are equal, round, and reactive to light.  Eyes symmetric  Neck: Neck supple. No tracheal deviation present.  Cardiovascular: Normal rate, regular rhythm and normal heart sounds.  Exam reveals no gallop and no friction rub.   No murmur heard. Pulmonary/Chest: Effort normal and breath sounds normal. No respiratory distress.  Abdominal: Soft. Bowel sounds are normal. There is no tenderness.  Musculoskeletal: Normal range of motion.  Able to move both feet, better on right.  Neurological: She is alert and oriented to person, place, and time.  Face symmetric. Mild left facial droop, not new per family. Strong grip strength on right. Chronic weakness on left side at baseline  Skin: Skin is warm and dry.  Psychiatric: She has a normal mood and affect. Her behavior is normal.    ED Course  Procedures (including critical care time)  COORDINATION OF CARE: 8:04 AM- Will order CBC w/ diff, BMP, EKG, UA, Head CT w/o contrast, and CXR.  Pt advised of plan for treatment and pt agrees.  Results for orders placed during the hospital encounter of 03/02/14  CBC WITH DIFFERENTIAL      Result Value Ref Range   WBC 7.9  4.0 - 10.5 K/uL   RBC 4.37  3.87 - 5.11 MIL/uL   Hemoglobin 12.3  12.0 - 15.0 g/dL   HCT 16.1  09.6 - 04.5 %   MCV 87.4  78.0 - 100.0 fL   MCH 28.1  26.0 - 34.0 pg   MCHC 32.2  30.0 - 36.0 g/dL   RDW 40.9  81.1 - 91.4 %   Platelets 261  150 - 400 K/uL   Neutrophils Relative % 78 (*) 43 - 77 %   Neutro Abs 6.1  1.7 - 7.7 K/uL   Lymphocytes Relative 12  12 - 46 %   Lymphs Abs 1.0  0.7 - 4.0 K/uL   Monocytes Relative 8  3 - 12 %   Monocytes Absolute 0.6  0.1 - 1.0 K/uL   Eosinophils Relative 2  0 - 5 %   Eosinophils Absolute 0.1  0.0 - 0.7 K/uL   Basophils  Relative 0  0 - 1 %   Basophils Absolute 0.0  0.0 - 0.1 K/uL  URINALYSIS, ROUTINE W REFLEX MICROSCOPIC      Result Value Ref Range   Color, Urine YELLOW  YELLOW   APPearance HAZY (*) CLEAR   Specific Gravity, Urine 1.015  1.005 - 1.030   pH 7.5  5.0 - 8.0  Glucose, UA NEGATIVE  NEGATIVE mg/dL   Hgb urine dipstick NEGATIVE  NEGATIVE   Bilirubin Urine NEGATIVE  NEGATIVE   Ketones, ur NEGATIVE  NEGATIVE mg/dL   Protein, ur NEGATIVE  NEGATIVE mg/dL   Urobilinogen, UA 0.2  0.0 - 1.0 mg/dL   Nitrite POSITIVE (*) NEGATIVE   Leukocytes, UA TRACE (*) NEGATIVE  COMPREHENSIVE METABOLIC PANEL      Result Value Ref Range   Sodium 142  137 - 147 mEq/L   Potassium 4.5  3.7 - 5.3 mEq/L   Chloride 102  96 - 112 mEq/L   CO2 30  19 - 32 mEq/L   Glucose, Bld 117 (*) 70 - 99 mg/dL   BUN 14  6 - 23 mg/dL   Creatinine, Ser 1.61  0.50 - 1.10 mg/dL   Calcium 9.0  8.4 - 09.6 mg/dL   Total Protein 7.0  6.0 - 8.3 g/dL   Albumin 3.3 (*) 3.5 - 5.2 g/dL   AST 34  0 - 37 U/L   ALT 36 (*) 0 - 35 U/L   Alkaline Phosphatase 114  39 - 117 U/L   Total Bilirubin 0.2 (*) 0.3 - 1.2 mg/dL   GFR calc non Af Amer >90  >90 mL/min   GFR calc Af Amer >90  >90 mL/min   Anion gap 10  5 - 15  URINE RAPID DRUG SCREEN (HOSP PERFORMED)      Result Value Ref Range   Opiates NONE DETECTED  NONE DETECTED   Cocaine NONE DETECTED  NONE DETECTED   Benzodiazepines NONE DETECTED  NONE DETECTED   Amphetamines NONE DETECTED  NONE DETECTED   Tetrahydrocannabinol NONE DETECTED  NONE DETECTED   Barbiturates NONE DETECTED  NONE DETECTED  TROPONIN I      Result Value Ref Range   Troponin I <0.30  <0.30 ng/mL  URINE MICROSCOPIC-ADD ON      Result Value Ref Range   WBC, UA 3-6  <3 WBC/hpf   RBC / HPF 3-6  <3 RBC/hpf   Bacteria, UA MANY (*) RARE  BASIC METABOLIC PANEL      Result Value Ref Range   Sodium 143  137 - 147 mEq/L   Potassium 4.3  3.7 - 5.3 mEq/L   Chloride 104  96 - 112 mEq/L   CO2 31  19 - 32 mEq/L   Glucose, Bld  97  70 - 99 mg/dL   BUN 13  6 - 23 mg/dL   Creatinine, Ser 0.45  0.50 - 1.10 mg/dL   Calcium 8.8  8.4 - 40.9 mg/dL   GFR calc non Af Amer >90  >90 mL/min   GFR calc Af Amer >90  >90 mL/min   Anion gap 8  5 - 15  CBC      Result Value Ref Range   WBC 8.3  4.0 - 10.5 K/uL   RBC 4.32  3.87 - 5.11 MIL/uL   Hemoglobin 12.1  12.0 - 15.0 g/dL   HCT 81.1  91.4 - 78.2 %   MCV 88.2  78.0 - 100.0 fL   MCH 28.0  26.0 - 34.0 pg   MCHC 31.8  30.0 - 36.0 g/dL   RDW 95.6  21.3 - 08.6 %   Platelets 250  150 - 400 K/uL     Labs Review Labs Reviewed  CBC WITH DIFFERENTIAL - Abnormal; Notable for the following:    Neutrophils Relative % 78 (*)    All other components within normal limits  URINALYSIS, ROUTINE W REFLEX MICROSCOPIC - Abnormal; Notable for the following:    APPearance HAZY (*)    Nitrite POSITIVE (*)    Leukocytes, UA TRACE (*)    All other components within normal limits  COMPREHENSIVE METABOLIC PANEL - Abnormal; Notable for the following:    Glucose, Bld 117 (*)    Albumin 3.3 (*)    ALT 36 (*)    Total Bilirubin 0.2 (*)    All other components within normal limits  URINE MICROSCOPIC-ADD ON - Abnormal; Notable for the following:    Bacteria, UA MANY (*)    All other components within normal limits  URINE CULTURE  URINE RAPID DRUG SCREEN (HOSP PERFORMED)  TROPONIN I  BASIC METABOLIC PANEL  CBC    Imaging Review Dg Chest 1 View  03/02/2014   CLINICAL DATA:  Altered mental status  EXAM: CHEST - 1 VIEW  COMPARISON:  None.  FINDINGS: Low lung volumes with vascular congestion and basilar atelectasis. Normal heart size. No edema, effusion or pneumothorax. Prior cholecystectomy noted. Monitor leads overlie the chest. Trachea midline.  IMPRESSION: Low volume exam with basilar atelectasis.   Electronically Signed   By: Ruel Favorsrevor  Shick M.D.   On: 03/02/2014 08:45   Ct Head Wo Contrast  03/02/2014   CLINICAL DATA:  Altered mental status  EXAM: CT HEAD WITHOUT CONTRAST  TECHNIQUE:  Contiguous axial images were obtained from the base of the skull through the vertex without contrast.  COMPARISON:  06/20/2013  FINDINGS: Stable mild atrophy pattern and periventricular white matter changes. Stable slight asymmetry in the lateral ventricles, larger on the right. Suspect a component of chronic ex vacuo dilatation. No acute intracranial hemorrhage, new infarction, mass lesion, midline shift, herniation, or extra-axial fluid collection. Cisterns patent. No cerebellar abnormality. Orbits are normal. Mastoids and sinuses clear.  IMPRESSION: Stable atrophy and chronic white matter changes. No acute or new process by noncontrast CT.   Electronically Signed   By: Ruel Favorsrevor  Shick M.D.   On: 03/02/2014 09:09     EKG Interpretation None      MDM   Final diagnoses:  UTI (lower urinary tract infection)  MS (multiple sclerosis)    Patient with generalized weakness confusion. History of MS. Positive urinary tract infection. May be the cause. Will admit to internal medicine.   I personally performed the services described in this documentation, which was scribed in my presence. The recorded information has been reviewed and is accurate.      Juliet RudeNathan R. Collier PayorPickering, MD 03/03/14 1335

## 2014-03-03 LAB — CBC
HCT: 38.1 % (ref 36.0–46.0)
Hemoglobin: 12.1 g/dL (ref 12.0–15.0)
MCH: 28 pg (ref 26.0–34.0)
MCHC: 31.8 g/dL (ref 30.0–36.0)
MCV: 88.2 fL (ref 78.0–100.0)
Platelets: 250 10*3/uL (ref 150–400)
RBC: 4.32 MIL/uL (ref 3.87–5.11)
RDW: 13.4 % (ref 11.5–15.5)
WBC: 8.3 10*3/uL (ref 4.0–10.5)

## 2014-03-03 LAB — BASIC METABOLIC PANEL
Anion gap: 8 (ref 5–15)
BUN: 13 mg/dL (ref 6–23)
CALCIUM: 8.8 mg/dL (ref 8.4–10.5)
CHLORIDE: 104 meq/L (ref 96–112)
CO2: 31 mEq/L (ref 19–32)
CREATININE: 0.69 mg/dL (ref 0.50–1.10)
GFR calc non Af Amer: >90 mL/min (ref >90)
Glucose, Bld: 97 mg/dL (ref 70–99)
Potassium: 4.3 mEq/L (ref 3.7–5.3)
Sodium: 143 mEq/L (ref 137–147)

## 2014-03-03 MED ORDER — NITROFURANTOIN MONOHYD MACRO 100 MG PO CAPS: 100.0000 mg | ORAL_CAPSULE | Freq: Every day | ORAL | Status: DC

## 2014-03-03 MED ORDER — CEFUROXIME AXETIL 500 MG PO TABS: 500.0000 mg | ORAL_TABLET | Freq: Two times a day (BID) | ORAL | Status: DC

## 2014-03-03 MED ORDER — CEFUROXIME AXETIL 250 MG PO TABS: 500.0000 mg | ORAL_TABLET | Freq: Two times a day (BID) | ORAL | Status: DC

## 2014-03-03 NOTE — Progress Notes (Signed)
UR completed 

## 2014-03-03 NOTE — Discharge Summary (Signed)
Physician Discharge Summary  Laurie Collier NVB:[id number] DOB: [birth year] DOA: 03/02/2014  PCP: Laurie Ribas, MD  Admit date: 03/02/2014 Discharge date: 03/03/2014  Recommendations for Outpatient Follow-up:  1. Followup resolution of UTI, urine culture pending at this time 2. Consider outpatient sleep study, see below   Follow-up Information   Follow up with Laurie Ribas, MD. Schedule an appointment as soon as possible for a visit in 1 week.   Specialty:  Family Medicine   Contact information:   [address] [place] [address] [phone]      Discharge Diagnoses:  1. UTI 2. Acute encephalopathy 3. Hypopnea  Discharge Condition: improved Disposition: home   Diet recommendation: regular  Filed Weights   03/02/14 0751 03/02/14 1148  Weight: 63.504 kg (140 lb) 63.5 kg (139 lb 15.9 oz)    History of present illness:  53 year old woman with h/o MS, stroke presented to ED after period of unresponsiveness and hypopnea this AM. Initial evaluation was notable only for possible UTI.  Hospital Course:  [name] was admitted for observation, monitored on telemetry. She had no breathing difficulty, telemetry was unremarkable. She had no pain, no neurologic issues. Her urinalysis did suggest infection, given chronic suppressive therapy with Macrobid and self-catheterization most likely her acute illness reflects the UTI. She is improved with empiric Rocephin, plan transition to oral cephalosporin and discharged home.   1. UTI. 2. Acute encephalopathy, resolved. Presumed secondary to UTI. No neurologic changes, no history to suggest ACS, pulmonary or CNS event. 3. Hypopnea. Resolved. No hypoxia, no chest pain or shortness of breath. Consider outpatient evaluation for sleep study although no evidence of sleep apnea here. Wells 0. 4. H/o MS 5. History of stroke with residual left upper, left lower extremity weakness and chronic facial weakness Discussed  workup in detail with husband and mother and sister at bedside. Patient's back at baseline, plan discharge home on oral antibiotics. Monitor for breathing problems or worsening of condition.  Consultants:  none Procedures:  none Antibiotics:  Ceftriaxone 8/22 >> 8/23  Ceftin 8/24 >> 8/28  Discharge Instructions  Discharge Instructions   Activity as tolerated - No restrictions    Complete by:  As directed      Diet general    Complete by:  As directed      Discharge instructions    Complete by:  As directed   Call your physician or seek immediate medical attention for breathing problems, confusion or worsening of condition.            Medication List         atorvastatin 10 MG tablet  Commonly known as:  LIPITOR  Take 10 mg by mouth at bedtime.     baclofen 10 MG tablet  Commonly known as:  LIORESAL  Take 10 mg by mouth 3 (three) times daily as needed (uses if the pump isn't working or she becomes stiff).     BACLOFEN IT  by Intrathecal route continuous. Baclofen pump     cefUROXime 500 MG tablet  Commonly known as:  CEFTIN  Take 1 tablet (500 mg total) by mouth 2 (two) times daily with a meal. Start 8/24 in the morning.  Start taking on:  03/04/2014     clopidogrel 75 MG tablet  Commonly known as:  PLAVIX  Take 75 mg by mouth daily.     FLUoxetine 40 MG capsule  Commonly known as:  PROZAC  Take 40 mg by mouth 2 (two) times daily.  methylphenidate 10 MG tablet  Commonly known as:  RITALIN  Take 10 mg by mouth 2 (two) times daily with breakfast and lunch.     multivitamin with minerals Tabs tablet  Take 1 tablet by mouth daily.     nitrofurantoin (macrocrystal-monohydrate) 100 MG capsule  Commonly known as:  MACROBID  Take 1 capsule (100 mg total) by mouth daily. Hold this medication until you have completed Ceftin. Then resume nitrofurantoin.     pilocarpine 5 MG tablet  Commonly known as:  SALAGEN  Take 5 mg by mouth daily.     zolpidem 10 MG tablet   Commonly known as:  AMBIEN  Take 10 mg by mouth at bedtime.       Allergies  Allergen Reactions  . Sulfa Antibiotics     Reaction unknown    The results of significant diagnostics from this hospitalization (including imaging, microbiology, ancillary and laboratory) are listed below for reference.    Significant Diagnostic Studies: Dg Chest 1 View  03/02/2014   CLINICAL DATA:  Altered mental status  EXAM: CHEST - 1 VIEW  COMPARISON:  None.  FINDINGS: Low lung volumes with vascular congestion and basilar atelectasis. Normal heart size. No edema, effusion or pneumothorax. Prior cholecystectomy noted. Monitor leads overlie the chest. Trachea midline.  IMPRESSION: Low volume exam with basilar atelectasis.   Electronically Signed   By: Ruel Favors M.D.   On: 03/02/2014 08:45   Ct Head Wo Contrast  03/02/2014   CLINICAL DATA:  Altered mental status  EXAM: CT HEAD WITHOUT CONTRAST  TECHNIQUE: Contiguous axial images were obtained from the base of the skull through the vertex without contrast.  COMPARISON:  06/20/2013  FINDINGS: Stable mild atrophy pattern and periventricular white matter changes. Stable slight asymmetry in the lateral ventricles, larger on the right. Suspect a component of chronic ex vacuo dilatation. No acute intracranial hemorrhage, new infarction, mass lesion, midline shift, herniation, or extra-axial fluid collection. Cisterns patent. No cerebellar abnormality. Orbits are normal. Mastoids and sinuses clear.  IMPRESSION: Stable atrophy and chronic white matter changes. No acute or new process by noncontrast CT.   Electronically Signed   By: Ruel Favors M.D.   On: 03/02/2014 09:09    Labs: Basic Metabolic Panel:  Recent Labs Lab 03/02/14 0756 03/03/14 0626  NA 142 143  K 4.5 4.3  CL 102 104  CO2 30 31  GLUCOSE 117* 97  BUN 14 13  CREATININE 0.59 0.69  CALCIUM 9.0 8.8   Liver Function Tests:  Recent Labs Lab 03/02/14 0756  AST 34  ALT 36*  ALKPHOS 114    BILITOT 0.2*  PROT 7.0  ALBUMIN 3.3*   CBC:  Recent Labs Lab 03/02/14 0755 03/03/14 0626  WBC 7.9 8.3  NEUTROABS 6.1  --   HGB 12.3 12.1  HCT 38.2 38.1  MCV 87.4 88.2  PLT 261 250   Cardiac Enzymes:  Recent Labs Lab 03/02/14 0756  TROPONINI <0.30    Principal Problem:   UTI (lower urinary tract infection) Active Problems:   Acute encephalopathy   Sleep hypopnea   Time coordinating discharge: 25 minutes  Signed:  Brendia Sacks, MD [facility] 03/03/2014, 1:17 PM

## 2014-03-03 NOTE — Progress Notes (Signed)
PROGRESS NOTE  Laurie Collier IBB:[id number] DOB: 24-Nov-1960 DOA: 03/02/2014 PCP: Colette Ribas, MD  Summary: 53 year old woman with h/o MS, stroke presented to ED after period of unresponsiveness and hypopnea this AM. Initial evaluation was notable only for possible UTI.  Assessment/Plan: 1. UTI. 2. Acute encephalopathy, resolved. Presumed secondary to the GI. No neurologic changes, no history to suggest ACS, pulmonary or CNS event. 3. Hypopnea. Resolved. No hypoxia, no chest pain or shortness of breath. Consider outpatient evaluation for sleep study although no evidence of sleep apnea here. Wells 0. 4. H/o MS 5. History of stroke with residual left upper, left lower short he weakness and chronic facial weakness   Doing well. Appears to be at baseline. No evidence of acute process other than UTI.  Change to oral abx  Discussed workup in detail with husband and mother and sister at bedside. Patient's back at baseline, plan discharge home on oral antibiotics. Monitor for breathing problems or worsening of condition.  Brendia Sacks, MD  [facility]  Pager [phone] If 7PM-7AM, please contact night-coverage at [email or web address], password [facility] 03/03/2014, 1:06 PM  LOS: 1 day   Consultants:  none  Procedures:  none  Antibiotics:  Ceftriaxone 8/22 >> 8/23  Ceftin 8/24 >> 8/28  HPI/Subjective: No issues overnight, no concerns per RN.  Patient feels much better today. She has no trouble breathing, no pain. He well. No new weakness.  Husband at bedside reports she is much improved, he has no concerns at this point. Breathing well. No neurologic issues none.  Objective: Filed Vitals:   03/02/14 1000 03/02/14 1148 03/02/14 2202 03/03/14 0540  BP: 134/69 117/54 108/44 111/56  Pulse: 84 85 87 82  Temp:  98.6 F (37 C) 98.5 F (36.9 C) 98.1 F (36.7 C)  TempSrc:  Oral Oral Oral  Resp: 27 23 24 20   Height:  5\' 4"  (1.626 m)    Weight:  63.5 kg (139 lb 15.9 oz)     SpO2: 96% 99% 97% 98%    Intake/Output Summary (Last 24 hours) at 03/03/14 1306 Last data filed at 03/03/14 0959  Gross per 24 hour  Intake    123 ml  Output    850 ml  Net   -727 ml     Filed Weights   03/02/14 0751 03/02/14 1148  Weight: 63.504 kg (140 lb) 63.5 kg (139 lb 15.9 oz)    Exam:     Afebrile, vital signs are stable. Gen. Appears calm, comfortable. Appears much better today.  Psych. Alert. Speech fluent and clear. Oriented to self, family present, month, year, hospital, president  Eyes.  Pupils equal, round, reactive to light  Cardiovascular.  Regular rate and rhythm. No murmur, rub or gallop. No lower extremity edema. Telemetry sinus rhythm.  Respiratory.  Clear to auscultation bilaterally. No wheezes, rales or rhonchi. Normal respiratory effort.  Skin.  Appears grossly unremarkable.  Musculoskeletal.  Exam unchanged. She has some left upper and left lower extremity weakness which is baseline. Moves all extremities well. Able to lift both arms and both legs off the bed.  Neurologic.  Cranial nerves appear intact. Chronic left-sided weakness and chronic facial droop appears stable.  Data Reviewed:  Basic metabolic panel unremarkable.  CBC unremarkable.  Urine culture pending.  Scheduled Meds: . atorvastatin  10 mg Oral QHS  . cefTRIAXone (ROCEPHIN)  IV  1 g Intravenous Q24H  . clopidogrel  75 mg Oral Daily  . enoxaparin (LOVENOX) injection  40 mg Subcutaneous Daily  .  FLUoxetine  40 mg Oral BID  . methylphenidate  10 mg Oral BID WC  . pilocarpine  5 mg Oral Daily  . sodium chloride  3 mL Intravenous Q12H  . sodium chloride  3 mL Intravenous Q12H  . zolpidem  5 mg Oral QHS   Continuous Infusions:   Principal Problem:   UTI (lower urinary tract infection) Active Problems:   Acute encephalopathy   Sleep hypopnea

## 2014-03-04 LAB — URINE CULTURE: Colony Count: >=100000

## 2015-08-21 DIAGNOSIS — G35 Multiple sclerosis: Secondary | ICD-10-CM | POA: Diagnosis not present

## 2015-08-21 DIAGNOSIS — R4182 Altered mental status, unspecified: Secondary | ICD-10-CM | POA: Diagnosis not present

## 2015-08-21 DIAGNOSIS — G309 Alzheimer's disease, unspecified: Secondary | ICD-10-CM | POA: Diagnosis not present

## 2015-08-21 DIAGNOSIS — I69854 Hemiplegia and hemiparesis following other cerebrovascular disease affecting left non-dominant side: Secondary | ICD-10-CM | POA: Diagnosis not present

## 2015-08-21 DIAGNOSIS — E531 Pyridoxine deficiency: Secondary | ICD-10-CM | POA: Diagnosis not present

## 2015-08-21 DIAGNOSIS — G5603 Carpal tunnel syndrome, bilateral upper limbs: Secondary | ICD-10-CM | POA: Diagnosis not present

## 2015-08-21 DIAGNOSIS — F0150 Vascular dementia without behavioral disturbance: Secondary | ICD-10-CM | POA: Diagnosis not present

## 2015-08-21 DIAGNOSIS — G3184 Mild cognitive impairment, so stated: Secondary | ICD-10-CM | POA: Diagnosis not present

## 2015-08-21 DIAGNOSIS — G8114 Spastic hemiplegia affecting left nondominant side: Secondary | ICD-10-CM | POA: Diagnosis not present

## 2015-09-04 DIAGNOSIS — G3184 Mild cognitive impairment, so stated: Secondary | ICD-10-CM | POA: Diagnosis not present

## 2015-09-04 DIAGNOSIS — G8114 Spastic hemiplegia affecting left nondominant side: Secondary | ICD-10-CM | POA: Diagnosis not present

## 2015-09-04 DIAGNOSIS — G4701 Insomnia due to medical condition: Secondary | ICD-10-CM | POA: Diagnosis not present

## 2015-09-04 DIAGNOSIS — G35 Multiple sclerosis: Secondary | ICD-10-CM | POA: Diagnosis not present

## 2015-09-04 DIAGNOSIS — H9313 Tinnitus, bilateral: Secondary | ICD-10-CM | POA: Diagnosis not present

## 2015-09-04 DIAGNOSIS — I69854 Hemiplegia and hemiparesis following other cerebrovascular disease affecting left non-dominant side: Secondary | ICD-10-CM | POA: Diagnosis not present

## 2015-11-13 DIAGNOSIS — G8114 Spastic hemiplegia affecting left nondominant side: Secondary | ICD-10-CM | POA: Diagnosis not present

## 2015-11-13 DIAGNOSIS — I69854 Hemiplegia and hemiparesis following other cerebrovascular disease affecting left non-dominant side: Secondary | ICD-10-CM | POA: Diagnosis not present

## 2015-11-13 DIAGNOSIS — G3184 Mild cognitive impairment, so stated: Secondary | ICD-10-CM | POA: Diagnosis not present

## 2015-11-13 DIAGNOSIS — H9313 Tinnitus, bilateral: Secondary | ICD-10-CM | POA: Diagnosis not present

## 2015-11-13 DIAGNOSIS — G35 Multiple sclerosis: Secondary | ICD-10-CM | POA: Diagnosis not present

## 2015-12-11 DIAGNOSIS — I69854 Hemiplegia and hemiparesis following other cerebrovascular disease affecting left non-dominant side: Secondary | ICD-10-CM | POA: Diagnosis not present

## 2015-12-11 DIAGNOSIS — Z79899 Other long term (current) drug therapy: Secondary | ICD-10-CM | POA: Diagnosis not present

## 2015-12-11 DIAGNOSIS — G35 Multiple sclerosis: Secondary | ICD-10-CM | POA: Diagnosis not present

## 2015-12-11 DIAGNOSIS — R6889 Other general symptoms and signs: Secondary | ICD-10-CM | POA: Diagnosis not present

## 2015-12-11 DIAGNOSIS — G3184 Mild cognitive impairment, so stated: Secondary | ICD-10-CM | POA: Diagnosis not present

## 2015-12-11 DIAGNOSIS — H9313 Tinnitus, bilateral: Secondary | ICD-10-CM | POA: Diagnosis not present

## 2016-01-08 DIAGNOSIS — G3184 Mild cognitive impairment, so stated: Secondary | ICD-10-CM | POA: Diagnosis not present

## 2016-01-08 DIAGNOSIS — I69854 Hemiplegia and hemiparesis following other cerebrovascular disease affecting left non-dominant side: Secondary | ICD-10-CM | POA: Diagnosis not present

## 2016-01-08 DIAGNOSIS — G35 Multiple sclerosis: Secondary | ICD-10-CM | POA: Diagnosis not present

## 2016-01-08 DIAGNOSIS — G8114 Spastic hemiplegia affecting left nondominant side: Secondary | ICD-10-CM | POA: Diagnosis not present

## 2016-01-22 DIAGNOSIS — Z8673 Personal history of transient ischemic attack (TIA), and cerebral infarction without residual deficits: Secondary | ICD-10-CM | POA: Diagnosis not present

## 2016-01-22 DIAGNOSIS — Z7902 Long term (current) use of antithrombotics/antiplatelets: Secondary | ICD-10-CM | POA: Diagnosis not present

## 2016-01-22 DIAGNOSIS — I67848 Other cerebrovascular vasospasm and vasoconstriction: Secondary | ICD-10-CM | POA: Diagnosis not present

## 2016-01-22 DIAGNOSIS — G35 Multiple sclerosis: Secondary | ICD-10-CM | POA: Diagnosis not present

## 2016-01-22 DIAGNOSIS — Z48811 Encounter for surgical aftercare following surgery on the nervous system: Secondary | ICD-10-CM | POA: Diagnosis not present

## 2016-01-22 DIAGNOSIS — F419 Anxiety disorder, unspecified: Secondary | ICD-10-CM | POA: Diagnosis not present

## 2016-01-22 DIAGNOSIS — Z451 Encounter for adjustment and management of infusion pump: Secondary | ICD-10-CM | POA: Diagnosis not present

## 2016-01-22 DIAGNOSIS — Z79899 Other long term (current) drug therapy: Secondary | ICD-10-CM | POA: Diagnosis not present

## 2016-01-22 DIAGNOSIS — R252 Cramp and spasm: Secondary | ICD-10-CM | POA: Diagnosis not present

## 2016-01-28 DIAGNOSIS — Z6824 Body mass index (BMI) 24.0-24.9, adult: Secondary | ICD-10-CM | POA: Diagnosis not present

## 2016-01-28 DIAGNOSIS — G35 Multiple sclerosis: Secondary | ICD-10-CM | POA: Diagnosis not present

## 2016-01-28 DIAGNOSIS — Z0001 Encounter for general adult medical examination with abnormal findings: Secondary | ICD-10-CM | POA: Diagnosis not present

## 2016-01-28 DIAGNOSIS — E782 Mixed hyperlipidemia: Secondary | ICD-10-CM | POA: Diagnosis not present

## 2016-03-18 DIAGNOSIS — G35 Multiple sclerosis: Secondary | ICD-10-CM | POA: Diagnosis not present

## 2016-03-18 DIAGNOSIS — G3184 Mild cognitive impairment, so stated: Secondary | ICD-10-CM | POA: Diagnosis not present

## 2016-03-18 DIAGNOSIS — G8114 Spastic hemiplegia affecting left nondominant side: Secondary | ICD-10-CM | POA: Diagnosis not present

## 2016-05-13 DIAGNOSIS — G35 Multiple sclerosis: Secondary | ICD-10-CM | POA: Diagnosis not present

## 2016-05-13 DIAGNOSIS — G8114 Spastic hemiplegia affecting left nondominant side: Secondary | ICD-10-CM | POA: Diagnosis not present

## 2016-05-13 DIAGNOSIS — G3184 Mild cognitive impairment, so stated: Secondary | ICD-10-CM | POA: Diagnosis not present

## 2016-05-13 DIAGNOSIS — I69854 Hemiplegia and hemiparesis following other cerebrovascular disease affecting left non-dominant side: Secondary | ICD-10-CM | POA: Diagnosis not present

## 2016-05-27 DIAGNOSIS — Z23 Encounter for immunization: Secondary | ICD-10-CM | POA: Diagnosis not present

## 2016-06-10 DIAGNOSIS — I6601 Occlusion and stenosis of right middle cerebral artery: Secondary | ICD-10-CM | POA: Diagnosis not present

## 2016-06-10 DIAGNOSIS — N390 Urinary tract infection, site not specified: Secondary | ICD-10-CM | POA: Diagnosis not present

## 2016-06-10 DIAGNOSIS — G35 Multiple sclerosis: Secondary | ICD-10-CM | POA: Diagnosis not present

## 2016-06-10 DIAGNOSIS — Z1389 Encounter for screening for other disorder: Secondary | ICD-10-CM | POA: Diagnosis not present

## 2016-06-10 DIAGNOSIS — N319 Neuromuscular dysfunction of bladder, unspecified: Secondary | ICD-10-CM | POA: Diagnosis not present

## 2016-06-10 DIAGNOSIS — I639 Cerebral infarction, unspecified: Secondary | ICD-10-CM | POA: Diagnosis not present

## 2016-06-10 DIAGNOSIS — Z681 Body mass index (BMI) 19 or less, adult: Secondary | ICD-10-CM | POA: Diagnosis not present

## 2016-06-10 DIAGNOSIS — B372 Candidiasis of skin and nail: Secondary | ICD-10-CM | POA: Diagnosis not present

## 2016-06-16 ENCOUNTER — Other Ambulatory Visit: Payer: Self-pay | Admitting: Family Medicine

## 2016-06-16 DIAGNOSIS — Z1231 Encounter for screening mammogram for malignant neoplasm of breast: Secondary | ICD-10-CM

## 2016-07-07 DIAGNOSIS — E669 Obesity, unspecified: Secondary | ICD-10-CM | POA: Diagnosis not present

## 2016-07-07 DIAGNOSIS — E782 Mixed hyperlipidemia: Secondary | ICD-10-CM | POA: Diagnosis not present

## 2016-07-07 DIAGNOSIS — Z1389 Encounter for screening for other disorder: Secondary | ICD-10-CM | POA: Diagnosis not present

## 2016-07-07 DIAGNOSIS — Z681 Body mass index (BMI) 19 or less, adult: Secondary | ICD-10-CM | POA: Diagnosis not present

## 2016-07-07 DIAGNOSIS — N342 Other urethritis: Secondary | ICD-10-CM | POA: Diagnosis not present

## 2016-07-07 DIAGNOSIS — G35 Multiple sclerosis: Secondary | ICD-10-CM | POA: Diagnosis not present

## 2016-07-07 DIAGNOSIS — J302 Other seasonal allergic rhinitis: Secondary | ICD-10-CM | POA: Diagnosis not present

## 2016-07-08 DIAGNOSIS — G3184 Mild cognitive impairment, so stated: Secondary | ICD-10-CM | POA: Diagnosis not present

## 2016-07-08 DIAGNOSIS — G8114 Spastic hemiplegia affecting left nondominant side: Secondary | ICD-10-CM | POA: Diagnosis not present

## 2016-07-08 DIAGNOSIS — I69854 Hemiplegia and hemiparesis following other cerebrovascular disease affecting left non-dominant side: Secondary | ICD-10-CM | POA: Diagnosis not present

## 2016-07-08 DIAGNOSIS — G35 Multiple sclerosis: Secondary | ICD-10-CM | POA: Diagnosis not present

## 2016-09-02 DIAGNOSIS — G35 Multiple sclerosis: Secondary | ICD-10-CM | POA: Diagnosis not present

## 2016-09-02 DIAGNOSIS — G3184 Mild cognitive impairment, so stated: Secondary | ICD-10-CM | POA: Diagnosis not present

## 2016-09-02 DIAGNOSIS — I69854 Hemiplegia and hemiparesis following other cerebrovascular disease affecting left non-dominant side: Secondary | ICD-10-CM | POA: Diagnosis not present

## 2016-09-02 DIAGNOSIS — G8114 Spastic hemiplegia affecting left nondominant side: Secondary | ICD-10-CM | POA: Diagnosis not present

## 2016-10-22 DIAGNOSIS — Z681 Body mass index (BMI) 19 or less, adult: Secondary | ICD-10-CM | POA: Diagnosis not present

## 2016-10-22 DIAGNOSIS — N342 Other urethritis: Secondary | ICD-10-CM | POA: Diagnosis not present

## 2016-10-22 DIAGNOSIS — G40101 Localization-related (focal) (partial) symptomatic epilepsy and epileptic syndromes with simple partial seizures, not intractable, with status epilepticus: Secondary | ICD-10-CM | POA: Diagnosis not present

## 2016-10-22 DIAGNOSIS — G35 Multiple sclerosis: Secondary | ICD-10-CM | POA: Diagnosis not present

## 2016-10-28 DIAGNOSIS — I69854 Hemiplegia and hemiparesis following other cerebrovascular disease affecting left non-dominant side: Secondary | ICD-10-CM | POA: Diagnosis not present

## 2016-10-28 DIAGNOSIS — G8114 Spastic hemiplegia affecting left nondominant side: Secondary | ICD-10-CM | POA: Diagnosis not present

## 2016-10-28 DIAGNOSIS — G3184 Mild cognitive impairment, so stated: Secondary | ICD-10-CM | POA: Diagnosis not present

## 2016-10-28 DIAGNOSIS — G35 Multiple sclerosis: Secondary | ICD-10-CM | POA: Diagnosis not present

## 2016-11-17 DIAGNOSIS — Z681 Body mass index (BMI) 19 or less, adult: Secondary | ICD-10-CM | POA: Diagnosis not present

## 2016-11-17 DIAGNOSIS — N390 Urinary tract infection, site not specified: Secondary | ICD-10-CM | POA: Diagnosis not present

## 2016-11-17 DIAGNOSIS — Z1389 Encounter for screening for other disorder: Secondary | ICD-10-CM | POA: Diagnosis not present

## 2016-11-17 DIAGNOSIS — B49 Unspecified mycosis: Secondary | ICD-10-CM | POA: Diagnosis not present

## 2016-11-17 DIAGNOSIS — N342 Other urethritis: Secondary | ICD-10-CM | POA: Diagnosis not present

## 2016-11-27 DIAGNOSIS — Z1211 Encounter for screening for malignant neoplasm of colon: Secondary | ICD-10-CM | POA: Diagnosis not present

## 2016-11-30 ENCOUNTER — Encounter: Payer: Self-pay | Admitting: *Deleted

## 2016-12-02 ENCOUNTER — Encounter: Payer: Self-pay | Admitting: Adult Health

## 2016-12-02 ENCOUNTER — Encounter (INDEPENDENT_AMBULATORY_CARE_PROVIDER_SITE_OTHER): Payer: Self-pay

## 2016-12-02 ENCOUNTER — Ambulatory Visit (INDEPENDENT_AMBULATORY_CARE_PROVIDER_SITE_OTHER): Payer: Medicare Other | Admitting: Adult Health

## 2016-12-02 ENCOUNTER — Other Ambulatory Visit (HOSPITAL_COMMUNITY)
Admission: RE | Admit: 2016-12-02 | Discharge: 2016-12-02 | Disposition: A | Discharge status: Home / Self Care | Payer: Medicare Other | Source: Ambulatory Visit | Attending: Adult Health | Admitting: Adult Health

## 2016-12-02 VITALS — BP 110/80 | HR 80

## 2016-12-02 DIAGNOSIS — Z01419 Encounter for gynecological examination (general) (routine) without abnormal findings: Secondary | ICD-10-CM | POA: Diagnosis not present

## 2016-12-02 DIAGNOSIS — Z1211 Encounter for screening for malignant neoplasm of colon: Secondary | ICD-10-CM

## 2016-12-02 DIAGNOSIS — Z1212 Encounter for screening for malignant neoplasm of rectum: Secondary | ICD-10-CM

## 2016-12-02 LAB — HEMOCCULT GUIAC POC 1CARD (OFFICE): Fecal Occult Blood, POC: NEGATIVE

## 2016-12-02 NOTE — Addendum Note (Signed)
Addended by: Federico Flake A on: 12/02/2016 12:17 PM   Modules accepted: Orders

## 2016-12-02 NOTE — Progress Notes (Addendum)
Subjective:     Patient ID: Laurie Collier, female   DOB: 06-04-1961, 56 y.o.   MRN: [id number]  HPI Laurie Collier is a 56 year old white female, married, G1P1, in for well woman gyn exam and pap.She has MS and had a stroke in 2008, has brace left lower leg and uses a wheelchair and has a care giver with her,she requests breast exam too.  PCP is Dr Phillips Odor.   Review of Systems  No problems with sex, or BMs has bladder pacemaker, she says she pees a lot,  and wears diaper. She is PM and denies any vaginal bleeding.  Reviewed past medical,surgical, social and family history. Reviewed medications and allergies.     Objective:   Physical Exam BP 110/80 (BP Location: Right Arm, Patient Position: Sitting, Cuff Size: Small)   Pulse 80     Skin warm and dry,  Breasts:no dominate palpable mass, retraction or nipple discharge.   Pelvic: external genitalia is normal in appearance no lesions, vagina: pale with loss of color, moisture and rugae,urethra has no lesions or masses noted, cervix:poorly visualized,pap with HPV done by blind sweep, uterus: normal size, shape and contour, non tender, no masses felt, adnexa: no masses or tenderness noted. Bladder is non tender and no masses felt, has bladder pacemaker.And was incontinent of urine during exam.On rectal exam has good tone, no polyps or hemorrhoids felt, and hemoccult was negative.  Assessment:     1. Encounter for gynecological examination with Papanicolaou smear of cervix   2. Screening for colorectal cancer       Plan:     Physical in 1 year if desired, pap in 3 years  if normal Get mammogram next week and yearly Labs with PCP Talk with PCP about colonoscopy, she says she does not want one

## 2016-12-08 LAB — CYTOLOGY - PAP
DIAGNOSIS: NEGATIVE
HPV (WINDOPATH): NOT DETECTED

## 2016-12-23 DIAGNOSIS — G35 Multiple sclerosis: Secondary | ICD-10-CM | POA: Diagnosis not present

## 2016-12-23 DIAGNOSIS — G8114 Spastic hemiplegia affecting left nondominant side: Secondary | ICD-10-CM | POA: Diagnosis not present

## 2016-12-23 DIAGNOSIS — I69854 Hemiplegia and hemiparesis following other cerebrovascular disease affecting left non-dominant side: Secondary | ICD-10-CM | POA: Diagnosis not present

## 2016-12-23 DIAGNOSIS — G3184 Mild cognitive impairment, so stated: Secondary | ICD-10-CM | POA: Diagnosis not present

## 2017-01-20 DIAGNOSIS — N342 Other urethritis: Secondary | ICD-10-CM | POA: Diagnosis not present

## 2017-01-20 DIAGNOSIS — N390 Urinary tract infection, site not specified: Secondary | ICD-10-CM | POA: Diagnosis not present

## 2017-01-20 DIAGNOSIS — R350 Frequency of micturition: Secondary | ICD-10-CM | POA: Diagnosis not present

## 2017-01-20 DIAGNOSIS — Z1389 Encounter for screening for other disorder: Secondary | ICD-10-CM | POA: Diagnosis not present

## 2017-01-20 DIAGNOSIS — Z681 Body mass index (BMI) 19 or less, adult: Secondary | ICD-10-CM | POA: Diagnosis not present

## 2017-02-09 DIAGNOSIS — H5203 Hypermetropia, bilateral: Secondary | ICD-10-CM | POA: Diagnosis not present

## 2017-02-17 DIAGNOSIS — I69854 Hemiplegia and hemiparesis following other cerebrovascular disease affecting left non-dominant side: Secondary | ICD-10-CM | POA: Diagnosis not present

## 2017-02-17 DIAGNOSIS — G35 Multiple sclerosis: Secondary | ICD-10-CM | POA: Diagnosis not present

## 2017-02-17 DIAGNOSIS — G3184 Mild cognitive impairment, so stated: Secondary | ICD-10-CM | POA: Diagnosis not present

## 2017-02-17 DIAGNOSIS — G8114 Spastic hemiplegia affecting left nondominant side: Secondary | ICD-10-CM | POA: Diagnosis not present

## 2017-04-14 DIAGNOSIS — G35 Multiple sclerosis: Secondary | ICD-10-CM | POA: Diagnosis not present

## 2017-04-14 DIAGNOSIS — I69854 Hemiplegia and hemiparesis following other cerebrovascular disease affecting left non-dominant side: Secondary | ICD-10-CM | POA: Diagnosis not present

## 2017-04-14 DIAGNOSIS — G3184 Mild cognitive impairment, so stated: Secondary | ICD-10-CM | POA: Diagnosis not present

## 2017-04-14 DIAGNOSIS — G8114 Spastic hemiplegia affecting left nondominant side: Secondary | ICD-10-CM | POA: Diagnosis not present

## 2017-05-12 DIAGNOSIS — G35 Multiple sclerosis: Secondary | ICD-10-CM | POA: Diagnosis not present

## 2017-05-12 DIAGNOSIS — G3184 Mild cognitive impairment, so stated: Secondary | ICD-10-CM | POA: Diagnosis not present

## 2017-05-12 DIAGNOSIS — G8114 Spastic hemiplegia affecting left nondominant side: Secondary | ICD-10-CM | POA: Diagnosis not present

## 2017-05-12 DIAGNOSIS — I69854 Hemiplegia and hemiparesis following other cerebrovascular disease affecting left non-dominant side: Secondary | ICD-10-CM | POA: Diagnosis not present

## 2017-06-15 DIAGNOSIS — G8114 Spastic hemiplegia affecting left nondominant side: Secondary | ICD-10-CM | POA: Diagnosis not present

## 2017-06-15 DIAGNOSIS — G3184 Mild cognitive impairment, so stated: Secondary | ICD-10-CM | POA: Diagnosis not present

## 2017-06-15 DIAGNOSIS — I69854 Hemiplegia and hemiparesis following other cerebrovascular disease affecting left non-dominant side: Secondary | ICD-10-CM | POA: Diagnosis not present

## 2017-06-15 DIAGNOSIS — Z23 Encounter for immunization: Secondary | ICD-10-CM | POA: Diagnosis not present

## 2017-06-15 DIAGNOSIS — Z79899 Other long term (current) drug therapy: Secondary | ICD-10-CM | POA: Diagnosis not present

## 2017-06-15 DIAGNOSIS — G35 Multiple sclerosis: Secondary | ICD-10-CM | POA: Diagnosis not present

## 2017-08-04 DIAGNOSIS — G8114 Spastic hemiplegia affecting left nondominant side: Secondary | ICD-10-CM | POA: Diagnosis not present

## 2017-08-04 DIAGNOSIS — G3184 Mild cognitive impairment, so stated: Secondary | ICD-10-CM | POA: Diagnosis not present

## 2017-08-04 DIAGNOSIS — G35 Multiple sclerosis: Secondary | ICD-10-CM | POA: Diagnosis not present

## 2017-08-04 DIAGNOSIS — I69854 Hemiplegia and hemiparesis following other cerebrovascular disease affecting left non-dominant side: Secondary | ICD-10-CM | POA: Diagnosis not present

## 2017-08-18 DIAGNOSIS — G3184 Mild cognitive impairment, so stated: Secondary | ICD-10-CM | POA: Diagnosis not present

## 2017-08-18 DIAGNOSIS — G8114 Spastic hemiplegia affecting left nondominant side: Secondary | ICD-10-CM | POA: Diagnosis not present

## 2017-08-18 DIAGNOSIS — I69854 Hemiplegia and hemiparesis following other cerebrovascular disease affecting left non-dominant side: Secondary | ICD-10-CM | POA: Diagnosis not present

## 2017-08-18 DIAGNOSIS — G35 Multiple sclerosis: Secondary | ICD-10-CM | POA: Diagnosis not present

## 2017-09-29 DIAGNOSIS — G8114 Spastic hemiplegia affecting left nondominant side: Secondary | ICD-10-CM | POA: Diagnosis not present

## 2017-09-29 DIAGNOSIS — I69854 Hemiplegia and hemiparesis following other cerebrovascular disease affecting left non-dominant side: Secondary | ICD-10-CM | POA: Diagnosis not present

## 2017-09-29 DIAGNOSIS — G3184 Mild cognitive impairment, so stated: Secondary | ICD-10-CM | POA: Diagnosis not present

## 2017-09-29 DIAGNOSIS — G35 Multiple sclerosis: Secondary | ICD-10-CM | POA: Diagnosis not present

## 2017-10-31 DIAGNOSIS — L89899 Pressure ulcer of other site, unspecified stage: Secondary | ICD-10-CM | POA: Diagnosis not present

## 2017-10-31 DIAGNOSIS — Z681 Body mass index (BMI) 19 or less, adult: Secondary | ICD-10-CM | POA: Diagnosis not present

## 2017-10-31 DIAGNOSIS — B354 Tinea corporis: Secondary | ICD-10-CM | POA: Diagnosis not present

## 2017-10-31 DIAGNOSIS — J309 Allergic rhinitis, unspecified: Secondary | ICD-10-CM | POA: Diagnosis not present

## 2017-11-24 DIAGNOSIS — G35 Multiple sclerosis: Secondary | ICD-10-CM | POA: Diagnosis not present

## 2017-11-24 DIAGNOSIS — G8114 Spastic hemiplegia affecting left nondominant side: Secondary | ICD-10-CM | POA: Diagnosis not present

## 2017-11-24 DIAGNOSIS — I69854 Hemiplegia and hemiparesis following other cerebrovascular disease affecting left non-dominant side: Secondary | ICD-10-CM | POA: Diagnosis not present

## 2017-11-24 DIAGNOSIS — G3184 Mild cognitive impairment, so stated: Secondary | ICD-10-CM | POA: Diagnosis not present

## 2018-01-16 DIAGNOSIS — I69854 Hemiplegia and hemiparesis following other cerebrovascular disease affecting left non-dominant side: Secondary | ICD-10-CM | POA: Diagnosis not present

## 2018-01-16 DIAGNOSIS — G3184 Mild cognitive impairment, so stated: Secondary | ICD-10-CM | POA: Diagnosis not present

## 2018-01-16 DIAGNOSIS — G8114 Spastic hemiplegia affecting left nondominant side: Secondary | ICD-10-CM | POA: Diagnosis not present

## 2018-01-16 DIAGNOSIS — G35 Multiple sclerosis: Secondary | ICD-10-CM | POA: Diagnosis not present

## 2018-01-18 DIAGNOSIS — Z1389 Encounter for screening for other disorder: Secondary | ICD-10-CM | POA: Diagnosis not present

## 2018-01-18 DIAGNOSIS — N342 Other urethritis: Secondary | ICD-10-CM | POA: Diagnosis not present

## 2018-01-18 DIAGNOSIS — Z23 Encounter for immunization: Secondary | ICD-10-CM | POA: Diagnosis not present

## 2018-01-18 DIAGNOSIS — Z0001 Encounter for general adult medical examination with abnormal findings: Secondary | ICD-10-CM | POA: Diagnosis not present

## 2018-01-18 DIAGNOSIS — Z681 Body mass index (BMI) 19 or less, adult: Secondary | ICD-10-CM | POA: Diagnosis not present

## 2018-01-18 DIAGNOSIS — Z0000 Encounter for general adult medical examination without abnormal findings: Secondary | ICD-10-CM | POA: Diagnosis not present

## 2018-03-03 DIAGNOSIS — J0100 Acute maxillary sinusitis, unspecified: Secondary | ICD-10-CM | POA: Diagnosis not present

## 2018-03-03 DIAGNOSIS — N342 Other urethritis: Secondary | ICD-10-CM | POA: Diagnosis not present

## 2018-03-03 DIAGNOSIS — G35 Multiple sclerosis: Secondary | ICD-10-CM | POA: Diagnosis not present

## 2018-03-03 DIAGNOSIS — Z681 Body mass index (BMI) 19 or less, adult: Secondary | ICD-10-CM | POA: Diagnosis not present

## 2018-03-16 DIAGNOSIS — G35 Multiple sclerosis: Secondary | ICD-10-CM | POA: Diagnosis not present

## 2018-03-16 DIAGNOSIS — G3184 Mild cognitive impairment, so stated: Secondary | ICD-10-CM | POA: Diagnosis not present

## 2018-03-16 DIAGNOSIS — I69854 Hemiplegia and hemiparesis following other cerebrovascular disease affecting left non-dominant side: Secondary | ICD-10-CM | POA: Diagnosis not present

## 2018-03-16 DIAGNOSIS — G8114 Spastic hemiplegia affecting left nondominant side: Secondary | ICD-10-CM | POA: Diagnosis not present

## 2018-03-23 DIAGNOSIS — N342 Other urethritis: Secondary | ICD-10-CM | POA: Diagnosis not present

## 2018-04-19 DIAGNOSIS — L8992 Pressure ulcer of unspecified site, stage 2: Secondary | ICD-10-CM | POA: Diagnosis not present

## 2018-04-19 DIAGNOSIS — Z681 Body mass index (BMI) 19 or less, adult: Secondary | ICD-10-CM | POA: Diagnosis not present

## 2018-04-19 DIAGNOSIS — Z1389 Encounter for screening for other disorder: Secondary | ICD-10-CM | POA: Diagnosis not present

## 2018-04-20 DIAGNOSIS — N342 Other urethritis: Secondary | ICD-10-CM | POA: Diagnosis not present

## 2018-04-27 DIAGNOSIS — G35 Multiple sclerosis: Secondary | ICD-10-CM | POA: Diagnosis not present

## 2018-04-27 DIAGNOSIS — I69854 Hemiplegia and hemiparesis following other cerebrovascular disease affecting left non-dominant side: Secondary | ICD-10-CM | POA: Diagnosis not present

## 2018-04-27 DIAGNOSIS — G8114 Spastic hemiplegia affecting left nondominant side: Secondary | ICD-10-CM | POA: Diagnosis not present

## 2018-04-27 DIAGNOSIS — G3184 Mild cognitive impairment, so stated: Secondary | ICD-10-CM | POA: Diagnosis not present

## 2018-05-10 DIAGNOSIS — R350 Frequency of micturition: Secondary | ICD-10-CM | POA: Diagnosis not present

## 2018-05-10 DIAGNOSIS — R7309 Other abnormal glucose: Secondary | ICD-10-CM | POA: Diagnosis not present

## 2018-05-10 DIAGNOSIS — L97312 Non-pressure chronic ulcer of right ankle with fat layer exposed: Secondary | ICD-10-CM | POA: Diagnosis not present

## 2018-05-10 DIAGNOSIS — Z681 Body mass index (BMI) 19 or less, adult: Secondary | ICD-10-CM | POA: Diagnosis not present

## 2018-05-10 DIAGNOSIS — N342 Other urethritis: Secondary | ICD-10-CM | POA: Diagnosis not present

## 2018-05-14 DIAGNOSIS — N342 Other urethritis: Secondary | ICD-10-CM | POA: Diagnosis not present

## 2018-05-14 DIAGNOSIS — Z79899 Other long term (current) drug therapy: Secondary | ICD-10-CM | POA: Diagnosis not present

## 2018-05-14 DIAGNOSIS — Z4800 Encounter for change or removal of nonsurgical wound dressing: Secondary | ICD-10-CM | POA: Diagnosis not present

## 2018-05-14 DIAGNOSIS — Z7902 Long term (current) use of antithrombotics/antiplatelets: Secondary | ICD-10-CM | POA: Diagnosis not present

## 2018-05-14 DIAGNOSIS — L89150 Pressure ulcer of sacral region, unstageable: Secondary | ICD-10-CM | POA: Diagnosis not present

## 2018-05-14 DIAGNOSIS — G35 Multiple sclerosis: Secondary | ICD-10-CM | POA: Diagnosis not present

## 2018-05-14 DIAGNOSIS — Z993 Dependence on wheelchair: Secondary | ICD-10-CM | POA: Diagnosis not present

## 2018-05-14 DIAGNOSIS — L89510 Pressure ulcer of right ankle, unstageable: Secondary | ICD-10-CM | POA: Diagnosis not present

## 2018-05-29 DIAGNOSIS — N342 Other urethritis: Secondary | ICD-10-CM | POA: Diagnosis not present

## 2018-05-29 DIAGNOSIS — G35 Multiple sclerosis: Secondary | ICD-10-CM | POA: Diagnosis not present

## 2018-06-01 DIAGNOSIS — Z23 Encounter for immunization: Secondary | ICD-10-CM | POA: Diagnosis not present

## 2018-06-15 DIAGNOSIS — L97519 Non-pressure chronic ulcer of other part of right foot with unspecified severity: Secondary | ICD-10-CM | POA: Diagnosis not present

## 2018-06-15 DIAGNOSIS — L89899 Pressure ulcer of other site, unspecified stage: Secondary | ICD-10-CM | POA: Diagnosis not present

## 2018-06-15 DIAGNOSIS — L89153 Pressure ulcer of sacral region, stage 3: Secondary | ICD-10-CM | POA: Diagnosis not present

## 2018-06-15 DIAGNOSIS — L89519 Pressure ulcer of right ankle, unspecified stage: Secondary | ICD-10-CM | POA: Diagnosis not present

## 2018-06-21 DIAGNOSIS — L89891 Pressure ulcer of other site, stage 1: Secondary | ICD-10-CM | POA: Diagnosis not present

## 2018-06-21 DIAGNOSIS — L89529 Pressure ulcer of left ankle, unspecified stage: Secondary | ICD-10-CM | POA: Diagnosis not present

## 2018-06-22 DIAGNOSIS — G8114 Spastic hemiplegia affecting left nondominant side: Secondary | ICD-10-CM | POA: Diagnosis not present

## 2018-06-22 DIAGNOSIS — G3184 Mild cognitive impairment, so stated: Secondary | ICD-10-CM | POA: Diagnosis not present

## 2018-06-22 DIAGNOSIS — G35 Multiple sclerosis: Secondary | ICD-10-CM | POA: Diagnosis not present

## 2018-06-22 DIAGNOSIS — I69854 Hemiplegia and hemiparesis following other cerebrovascular disease affecting left non-dominant side: Secondary | ICD-10-CM | POA: Diagnosis not present

## 2018-06-22 DIAGNOSIS — L89153 Pressure ulcer of sacral region, stage 3: Secondary | ICD-10-CM | POA: Diagnosis not present

## 2018-06-22 DIAGNOSIS — L89899 Pressure ulcer of other site, unspecified stage: Secondary | ICD-10-CM | POA: Diagnosis not present

## 2018-06-22 DIAGNOSIS — L89519 Pressure ulcer of right ankle, unspecified stage: Secondary | ICD-10-CM | POA: Diagnosis not present

## 2018-06-23 DIAGNOSIS — R300 Dysuria: Secondary | ICD-10-CM | POA: Diagnosis not present

## 2018-06-29 DIAGNOSIS — L89899 Pressure ulcer of other site, unspecified stage: Secondary | ICD-10-CM | POA: Diagnosis not present

## 2018-06-29 DIAGNOSIS — L89153 Pressure ulcer of sacral region, stage 3: Secondary | ICD-10-CM | POA: Diagnosis not present

## 2018-06-29 DIAGNOSIS — L89519 Pressure ulcer of right ankle, unspecified stage: Secondary | ICD-10-CM | POA: Diagnosis not present

## 2018-08-03 ENCOUNTER — Other Ambulatory Visit: Payer: Self-pay

## 2018-08-03 ENCOUNTER — Encounter (HOSPITAL_COMMUNITY): Payer: Self-pay | Admitting: Emergency Medicine

## 2018-08-03 ENCOUNTER — Inpatient Hospital Stay (HOSPITAL_COMMUNITY): Payer: Medicare Other

## 2018-08-03 ENCOUNTER — Emergency Department (HOSPITAL_COMMUNITY): Payer: Medicare Other

## 2018-08-03 ENCOUNTER — Inpatient Hospital Stay (HOSPITAL_COMMUNITY)
Admission: EM | Admit: 2018-08-03 | Discharge: 2018-08-12 | DRG: 853 | Disposition: E | Payer: Medicare Other | Attending: Family Medicine | Admitting: Family Medicine

## 2018-08-03 DIAGNOSIS — R700 Elevated erythrocyte sedimentation rate: Secondary | ICD-10-CM | POA: Diagnosis not present

## 2018-08-03 DIAGNOSIS — A419 Sepsis, unspecified organism: Principal | ICD-10-CM | POA: Diagnosis present

## 2018-08-03 DIAGNOSIS — G35 Multiple sclerosis: Secondary | ICD-10-CM | POA: Diagnosis present

## 2018-08-03 DIAGNOSIS — R0902 Hypoxemia: Secondary | ICD-10-CM | POA: Diagnosis not present

## 2018-08-03 DIAGNOSIS — L89210 Pressure ulcer of right hip, unstageable: Secondary | ICD-10-CM | POA: Diagnosis present

## 2018-08-03 DIAGNOSIS — Z7902 Long term (current) use of antithrombotics/antiplatelets: Secondary | ICD-10-CM | POA: Diagnosis not present

## 2018-08-03 DIAGNOSIS — Z882 Allergy status to sulfonamides status: Secondary | ICD-10-CM

## 2018-08-03 DIAGNOSIS — L8994 Pressure ulcer of unspecified site, stage 4: Secondary | ICD-10-CM

## 2018-08-03 DIAGNOSIS — Z79899 Other long term (current) drug therapy: Secondary | ICD-10-CM | POA: Diagnosis not present

## 2018-08-03 DIAGNOSIS — I69398 Other sequelae of cerebral infarction: Secondary | ICD-10-CM

## 2018-08-03 DIAGNOSIS — L89222 Pressure ulcer of left hip, stage 2: Secondary | ICD-10-CM | POA: Diagnosis present

## 2018-08-03 DIAGNOSIS — D72829 Elevated white blood cell count, unspecified: Secondary | ICD-10-CM | POA: Diagnosis present

## 2018-08-03 DIAGNOSIS — I69354 Hemiplegia and hemiparesis following cerebral infarction affecting left non-dominant side: Secondary | ICD-10-CM

## 2018-08-03 DIAGNOSIS — R0689 Other abnormalities of breathing: Secondary | ICD-10-CM

## 2018-08-03 DIAGNOSIS — J90 Pleural effusion, not elsewhere classified: Secondary | ICD-10-CM | POA: Diagnosis present

## 2018-08-03 DIAGNOSIS — N390 Urinary tract infection, site not specified: Secondary | ICD-10-CM | POA: Diagnosis present

## 2018-08-03 DIAGNOSIS — Z8249 Family history of ischemic heart disease and other diseases of the circulatory system: Secondary | ICD-10-CM | POA: Diagnosis not present

## 2018-08-03 DIAGNOSIS — J181 Lobar pneumonia, unspecified organism: Secondary | ICD-10-CM

## 2018-08-03 DIAGNOSIS — H539 Unspecified visual disturbance: Secondary | ICD-10-CM | POA: Diagnosis present

## 2018-08-03 DIAGNOSIS — E43 Unspecified severe protein-calorie malnutrition: Secondary | ICD-10-CM | POA: Diagnosis present

## 2018-08-03 DIAGNOSIS — L89220 Pressure ulcer of left hip, unstageable: Secondary | ICD-10-CM | POA: Diagnosis not present

## 2018-08-03 DIAGNOSIS — Z515 Encounter for palliative care: Secondary | ICD-10-CM | POA: Diagnosis not present

## 2018-08-03 DIAGNOSIS — Z9689 Presence of other specified functional implants: Secondary | ICD-10-CM | POA: Diagnosis present

## 2018-08-03 DIAGNOSIS — Z993 Dependence on wheelchair: Secondary | ICD-10-CM

## 2018-08-03 DIAGNOSIS — J189 Pneumonia, unspecified organism: Secondary | ICD-10-CM

## 2018-08-03 DIAGNOSIS — L89512 Pressure ulcer of right ankle, stage 2: Secondary | ICD-10-CM | POA: Diagnosis present

## 2018-08-03 DIAGNOSIS — Z66 Do not resuscitate: Secondary | ICD-10-CM | POA: Diagnosis present

## 2018-08-03 DIAGNOSIS — G894 Chronic pain syndrome: Secondary | ICD-10-CM | POA: Diagnosis present

## 2018-08-03 DIAGNOSIS — L89514 Pressure ulcer of right ankle, stage 4: Secondary | ICD-10-CM

## 2018-08-03 DIAGNOSIS — L89154 Pressure ulcer of sacral region, stage 4: Secondary | ICD-10-CM | POA: Diagnosis present

## 2018-08-03 DIAGNOSIS — I319 Disease of pericardium, unspecified: Secondary | ICD-10-CM | POA: Diagnosis not present

## 2018-08-03 DIAGNOSIS — G8194 Hemiplegia, unspecified affecting left nondominant side: Secondary | ICD-10-CM | POA: Diagnosis not present

## 2018-08-03 DIAGNOSIS — R0602 Shortness of breath: Secondary | ICD-10-CM

## 2018-08-03 DIAGNOSIS — I309 Acute pericarditis, unspecified: Secondary | ICD-10-CM | POA: Diagnosis present

## 2018-08-03 DIAGNOSIS — Z9049 Acquired absence of other specified parts of digestive tract: Secondary | ICD-10-CM | POA: Diagnosis not present

## 2018-08-03 DIAGNOSIS — A4189 Other specified sepsis: Secondary | ICD-10-CM

## 2018-08-03 DIAGNOSIS — I300 Acute nonspecific idiopathic pericarditis: Secondary | ICD-10-CM

## 2018-08-03 DIAGNOSIS — L8945 Pressure ulcer of contiguous site of back, buttock and hip, unstageable: Secondary | ICD-10-CM

## 2018-08-03 DIAGNOSIS — R0600 Dyspnea, unspecified: Secondary | ICD-10-CM

## 2018-08-03 DIAGNOSIS — Z9889 Other specified postprocedural states: Secondary | ICD-10-CM

## 2018-08-03 LAB — ECHOCARDIOGRAM COMPLETE
Height: 64 in
WEIGHTICAEL: 2169.33 [oz_av]

## 2018-08-03 LAB — RESPIRATORY PANEL BY PCR
Adenovirus: NOT DETECTED
Bordetella pertussis: NOT DETECTED
CHLAMYDOPHILA PNEUMONIAE-RVPPCR: NOT DETECTED
CORONAVIRUS NL63-RVPPCR: NOT DETECTED
Coronavirus 229E: NOT DETECTED
Coronavirus HKU1: NOT DETECTED
Coronavirus OC43: NOT DETECTED
Influenza A: NOT DETECTED
Influenza B: NOT DETECTED
Metapneumovirus: NOT DETECTED
Mycoplasma pneumoniae: NOT DETECTED
Parainfluenza Virus 1: NOT DETECTED
Parainfluenza Virus 2: NOT DETECTED
Parainfluenza Virus 3: NOT DETECTED
Parainfluenza Virus 4: NOT DETECTED
Respiratory Syncytial Virus: NOT DETECTED
Rhinovirus / Enterovirus: NOT DETECTED

## 2018-08-03 LAB — CBC WITH DIFFERENTIAL/PLATELET
Abs Immature Granulocytes: 1.16 10*3/uL — ABNORMAL HIGH (ref 0.00–0.07)
Basophils Absolute: 0.1 10*3/uL (ref 0.0–0.1)
Basophils Relative: 0 %
Eosinophils Absolute: 0 10*3/uL (ref 0.0–0.5)
Eosinophils Relative: 0 %
HCT: 29.1 % — ABNORMAL LOW (ref 36.0–46.0)
Hemoglobin: 8.8 g/dL — ABNORMAL LOW (ref 12.0–15.0)
Immature Granulocytes: 3 %
Lymphocytes Relative: 3 %
Lymphs Abs: 1.2 10*3/uL (ref 0.7–4.0)
MCH: 25.1 pg — AB (ref 26.0–34.0)
MCHC: 30.2 g/dL (ref 30.0–36.0)
MCV: 82.9 fL (ref 80.0–100.0)
Monocytes Absolute: 2.1 10*3/uL — ABNORMAL HIGH (ref 0.1–1.0)
Monocytes Relative: 5 %
Neutro Abs: 35.4 10*3/uL — ABNORMAL HIGH (ref 1.7–7.7)
Neutrophils Relative %: 89 %
Platelets: 476 10*3/uL — ABNORMAL HIGH (ref 150–400)
RBC: 3.51 MIL/uL — ABNORMAL LOW (ref 3.87–5.11)
RDW: 14.5 % (ref 11.5–15.5)
WBC: 40 10*3/uL — ABNORMAL HIGH (ref 4.0–10.5)
nRBC: 0.1 % (ref 0.0–0.2)

## 2018-08-03 LAB — URINALYSIS, ROUTINE W REFLEX MICROSCOPIC
Bacteria, UA: NONE SEEN
Glucose, UA: NEGATIVE mg/dL
Hgb urine dipstick: NEGATIVE
Ketones, ur: NEGATIVE mg/dL
Leukocytes, UA: NEGATIVE
Nitrite: NEGATIVE
Protein, ur: 100 mg/dL — AB
Specific Gravity, Urine: 1.031 — ABNORMAL HIGH (ref 1.005–1.030)
pH: 5 (ref 5.0–8.0)

## 2018-08-03 LAB — COMPREHENSIVE METABOLIC PANEL
ALT: 96 U/L — ABNORMAL HIGH (ref 0–44)
AST: 131 U/L — ABNORMAL HIGH (ref 15–41)
Albumin: 1.7 g/dL — ABNORMAL LOW (ref 3.5–5.0)
Alkaline Phosphatase: 171 U/L — ABNORMAL HIGH (ref 38–126)
Anion gap: 9 (ref 5–15)
BUN: 27 mg/dL — ABNORMAL HIGH (ref 6–20)
CHLORIDE: 103 mmol/L (ref 98–111)
CO2: 27 mmol/L (ref 22–32)
CREATININE: 0.51 mg/dL (ref 0.44–1.00)
Calcium: 7.4 mg/dL — ABNORMAL LOW (ref 8.9–10.3)
GFR calc Af Amer: >60 mL/min (ref >60)
Glucose, Bld: 128 mg/dL — ABNORMAL HIGH (ref 70–99)
Potassium: 3.7 mmol/L (ref 3.5–5.1)
Sodium: 139 mmol/L (ref 135–145)
Total Bilirubin: 0.8 mg/dL (ref 0.3–1.2)
Total Protein: 5.3 g/dL — ABNORMAL LOW (ref 6.5–8.1)

## 2018-08-03 LAB — TROPONIN I
Troponin I: 0.04 ng/mL (ref <0.03)
Troponin I: 0.03 ng/mL (ref <0.03)

## 2018-08-03 LAB — LACTIC ACID, PLASMA: Lactic Acid, Venous: 1.3 mmol/L (ref 0.5–1.9)

## 2018-08-03 LAB — BRAIN NATRIURETIC PEPTIDE: B Natriuretic Peptide: 302 pg/mL — ABNORMAL HIGH (ref 0.0–100.0)

## 2018-08-03 LAB — GROUP A STREP BY PCR: Group A Strep by PCR: NOT DETECTED

## 2018-08-03 LAB — SEDIMENTATION RATE: Sed Rate: 83 mm/hr — ABNORMAL HIGH (ref 0–22)

## 2018-08-03 MED ORDER — PILOCARPINE HCL 5 MG PO TABS
5.0000 mg | ORAL_TABLET | Freq: Two times a day (BID) | ORAL | Status: DC
  Filled 2018-08-03 (×8): qty 1

## 2018-08-03 MED ORDER — SODIUM CHLORIDE 0.9% FLUSH: 3.0000 mL | INTRAVENOUS | Status: DC | PRN

## 2018-08-03 MED ORDER — HEPARIN SODIUM (PORCINE) 5000 UNIT/ML IJ SOLN
5000.0000 [IU] | Freq: Three times a day (TID) | INTRAMUSCULAR | Status: DC
  Administered 2018-08-03 – 2018-08-05 (×4): 5000 [IU] via SUBCUTANEOUS
  Filled 2018-08-03 (×4): qty 1

## 2018-08-03 MED ORDER — GUAIFENESIN ER 600 MG PO TB12
600.0000 mg | ORAL_TABLET | Freq: Two times a day (BID) | ORAL | Status: DC
  Administered 2018-08-03 – 2018-08-04 (×2): 600 mg via ORAL
  Filled 2018-08-03 (×2): qty 1

## 2018-08-03 MED ORDER — ALBUTEROL SULFATE (2.5 MG/3ML) 0.083% IN NEBU
2.5000 mg | INHALATION_SOLUTION | RESPIRATORY_TRACT | Status: DC | PRN
  Administered 2018-08-05: 2.5 mg via RESPIRATORY_TRACT
  Filled 2018-08-03: qty 3

## 2018-08-03 MED ORDER — CALCIUM-MAGNESIUM-ZINC-D3 PO TABS: ORAL_TABLET | Freq: Every day | ORAL | Status: DC

## 2018-08-03 MED ORDER — DIMETHYL FUMARATE 240 MG PO CPDR
1.0000 | DELAYED_RELEASE_CAPSULE | Freq: Every day | ORAL | Status: DC
  Filled 2018-08-03 (×4): qty 1

## 2018-08-03 MED ORDER — PANTOPRAZOLE SODIUM 40 MG PO TBEC
40.0000 mg | DELAYED_RELEASE_TABLET | Freq: Every day | ORAL | Status: DC
  Administered 2018-08-03: 40 mg via ORAL
  Filled 2018-08-03: qty 1

## 2018-08-03 MED ORDER — BACLOFEN 10 MG PO TABS
10.0000 mg | ORAL_TABLET | Freq: Three times a day (TID) | ORAL | Status: DC | PRN
  Filled 2018-08-03: qty 1

## 2018-08-03 MED ORDER — TRAZODONE HCL 50 MG PO TABS
50.0000 mg | ORAL_TABLET | Freq: Every evening | ORAL | Status: DC | PRN
  Administered 2018-08-03 – 2018-08-04 (×2): 50 mg via ORAL
  Filled 2018-08-03 (×2): qty 1

## 2018-08-03 MED ORDER — ATORVASTATIN CALCIUM 10 MG PO TABS
10.0000 mg | ORAL_TABLET | Freq: Every day | ORAL | Status: DC
  Administered 2018-08-03 – 2018-08-04 (×2): 10 mg via ORAL
  Filled 2018-08-03 (×2): qty 1

## 2018-08-03 MED ORDER — SODIUM CHLORIDE 0.9 % IV SOLN
1000.0000 mL | INTRAVENOUS | Status: DC
  Administered 2018-08-03: 1000 mL via INTRAVENOUS

## 2018-08-03 MED ORDER — ADULT MULTIVITAMIN W/MINERALS CH
1.0000 | ORAL_TABLET | Freq: Every day | ORAL | Status: DC
  Administered 2018-08-03: 1 via ORAL
  Filled 2018-08-03: qty 1

## 2018-08-03 MED ORDER — SODIUM CHLORIDE 0.9 % IV BOLUS (SEPSIS)
1000.0000 mL | Freq: Once | INTRAVENOUS | Status: AC
  Administered 2018-08-03: 1000 mL via INTRAVENOUS

## 2018-08-03 MED ORDER — IBUPROFEN 400 MG PO TABS
600.0000 mg | ORAL_TABLET | Freq: Two times a day (BID) | ORAL | Status: DC
  Administered 2018-08-03: 600 mg via ORAL
  Filled 2018-08-03: qty 2
  Filled 2018-08-03: qty 1
  Filled 2018-08-03: qty 2

## 2018-08-03 MED ORDER — ALBUTEROL SULFATE (2.5 MG/3ML) 0.083% IN NEBU
2.5000 mg | INHALATION_SOLUTION | Freq: Once | RESPIRATORY_TRACT | Status: AC
  Administered 2018-08-03: 2.5 mg via RESPIRATORY_TRACT
  Filled 2018-08-03: qty 3

## 2018-08-03 MED ORDER — SODIUM CHLORIDE 0.9 % IV SOLN
500.0000 mg | INTRAVENOUS | Status: DC
  Administered 2018-08-04: 500 mg via INTRAVENOUS
  Filled 2018-08-03 (×4): qty 500

## 2018-08-03 MED ORDER — ACETAMINOPHEN 650 MG RE SUPP
650.0000 mg | Freq: Four times a day (QID) | RECTAL | Status: DC | PRN
  Administered 2018-08-04: 650 mg via RECTAL
  Filled 2018-08-03: qty 1

## 2018-08-03 MED ORDER — VANCOMYCIN HCL IN DEXTROSE 750-5 MG/150ML-% IV SOLN
750.0000 mg | Freq: Two times a day (BID) | INTRAVENOUS | Status: DC
  Administered 2018-08-04 – 2018-08-05 (×3): 750 mg via INTRAVENOUS
  Filled 2018-08-03 (×9): qty 150

## 2018-08-03 MED ORDER — FLUOXETINE HCL 20 MG PO CAPS
40.0000 mg | ORAL_CAPSULE | Freq: Two times a day (BID) | ORAL | Status: DC
  Administered 2018-08-03 – 2018-08-04 (×2): 40 mg via ORAL
  Filled 2018-08-03 (×2): qty 2

## 2018-08-03 MED ORDER — VANCOMYCIN HCL 10 G IV SOLR
1500.0000 mg | Freq: Once | INTRAVENOUS | Status: AC
  Administered 2018-08-03: 1500 mg via INTRAVENOUS
  Filled 2018-08-03: qty 1500

## 2018-08-03 MED ORDER — ALBUTEROL SULFATE (2.5 MG/3ML) 0.083% IN NEBU
2.5000 mg | INHALATION_SOLUTION | Freq: Three times a day (TID) | RESPIRATORY_TRACT | Status: DC
  Administered 2018-08-03 – 2018-08-04 (×4): 2.5 mg via RESPIRATORY_TRACT
  Filled 2018-08-03 (×5): qty 3

## 2018-08-03 MED ORDER — ACETAMINOPHEN 325 MG PO TABS
650.0000 mg | ORAL_TABLET | Freq: Four times a day (QID) | ORAL | Status: DC | PRN
  Administered 2018-08-04: 650 mg via ORAL
  Filled 2018-08-03: qty 2

## 2018-08-03 MED ORDER — POLYETHYLENE GLYCOL 3350 17 G PO PACK: 17.0000 g | PACK | Freq: Every day | ORAL | Status: DC | PRN

## 2018-08-03 MED ORDER — METHYLPHENIDATE HCL 5 MG PO TABS
10.0000 mg | ORAL_TABLET | Freq: Two times a day (BID) | ORAL | Status: DC
  Administered 2018-08-04: 10 mg via ORAL
  Filled 2018-08-03: qty 2

## 2018-08-03 MED ORDER — SODIUM CHLORIDE 0.9 % IV SOLN
INTRAVENOUS | Status: DC
  Administered 2018-08-04 (×2): via INTRAVENOUS

## 2018-08-03 MED ORDER — CLOPIDOGREL BISULFATE 75 MG PO TABS
75.0000 mg | ORAL_TABLET | Freq: Every day | ORAL | Status: DC
  Administered 2018-08-03: 75 mg via ORAL
  Filled 2018-08-03: qty 1

## 2018-08-03 MED ORDER — SODIUM CHLORIDE 0.9 % IV SOLN
1.0000 g | Freq: Once | INTRAVENOUS | Status: AC
  Administered 2018-08-03: 1 g via INTRAVENOUS
  Filled 2018-08-03: qty 10

## 2018-08-03 MED ORDER — SODIUM CHLORIDE 0.9 % IV SOLN
500.0000 mg | Freq: Once | INTRAVENOUS | Status: AC
  Administered 2018-08-03: 500 mg via INTRAVENOUS
  Filled 2018-08-03: qty 500

## 2018-08-03 MED ORDER — ONDANSETRON HCL 4 MG/2ML IJ SOLN: 4.0000 mg | Freq: Four times a day (QID) | INTRAMUSCULAR | Status: DC | PRN

## 2018-08-03 MED ORDER — ONDANSETRON HCL 4 MG PO TABS: 4.0000 mg | ORAL_TABLET | Freq: Four times a day (QID) | ORAL | Status: DC | PRN

## 2018-08-03 MED ORDER — SODIUM CHLORIDE 0.9 % IV SOLN
1.0000 g | INTRAVENOUS | Status: DC
  Filled 2018-08-03 (×4): qty 10

## 2018-08-03 MED ORDER — SODIUM CHLORIDE 0.9% FLUSH
3.0000 mL | Freq: Two times a day (BID) | INTRAVENOUS | Status: DC
  Administered 2018-08-04 (×2): 3 mL via INTRAVENOUS

## 2018-08-03 MED ORDER — COLCHICINE 0.6 MG PO TABS
0.6000 mg | ORAL_TABLET | Freq: Every day | ORAL | Status: DC
  Administered 2018-08-03: 0.6 mg via ORAL
  Filled 2018-08-03: qty 1

## 2018-08-03 MED ORDER — SODIUM CHLORIDE 0.9 % IV SOLN: 250.0000 mL | INTRAVENOUS | Status: DC | PRN

## 2018-08-03 NOTE — Consult Note (Signed)
[facility] Consult  Reason for Consult: Un-stagable sacral wound  Referring Physician:  Dr. Mariea Clonts   Chief Complaint    Shortness of Breath      Laurie Collier is a 58 y.o. female.  HPI: Laurie Collier is a 58 yo with MS and Stroke with left sided weakness who is bed bound. She has started to develop a sacral ulcer over the last few months and also has some wounds on her hips and her right lateral ankle.  Her husband reports that she has been eating less and likely taking in less protein. They also report that she has probably lost some weight over the last few months. She does not have a hospital bed at home, but they do try to turn her and also try to keep her cushioned.    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  She started having some fevers and heavy breathing and her family brought her to the ED.   Past Medical History:  Diagnosis Date  . Multiple sclerosis (HCC)   . Stroke [facility])    2008 with residual LUE/LLE weakness and visual disturbance    Past Surgical History:  Procedure Laterality Date  . bladder pacemaker    . CESAREAN SECTION    . CHOLECYSTECTOMY    . PAIN PUMP IMPLANTATION    . TONSILLECTOMY AND ADENOIDECTOMY    . TUBAL LIGATION      Family History  Problem Relation Age of Onset  . Cancer Father   . Diabetes Mother   . Leukemia Paternal Grandmother   . Leukemia Maternal Grandmother   . CAD Maternal Grandfather   . Heart attack Maternal Grandfather     Social History   Tobacco Use  . Smoking status: Never Smoker  . Smokeless tobacco: Never Used  Substance Use Topics  . Alcohol use: No  . Drug use: No    Medications:  I have reviewed the patient's current medications. Prior to Admission:  Medications Prior to Admission  Medication Sig Dispense Refill Last Dose  . atorvastatin (LIPITOR) 10 MG tablet Take 10 mg by mouth at bedtime.   08/02/2018 at Unknown time  .  baclofen (LIORESAL) 10 MG tablet Take 10 mg by mouth 3 (three) times daily as needed (uses if the pump isn't working or she becomes stiff).   Unknown  . BACLOFEN IT by Intrathecal route continuous. Baclofen pump   07/20/2018 at Unknown time  . clopidogrel (PLAVIX) 75 MG tablet Take 75 mg by mouth daily.   08/02/2018 at 1200  . Dimethyl Fumarate (TECFIDERA) 240 MG CPDR Take 1 capsule by mouth daily.    07/30/2018 at Unknown time  . FLUoxetine (PROZAC) 40 MG capsule Take 40 mg by mouth 2 (two) times daily.   07/15/2018 at Unknown time  . methylphenidate (RITALIN) 10 MG tablet Take 10 mg by mouth 2 (two) times daily with breakfast and lunch.   Past Week at Unknown time  . Multiple Minerals-Vitamins (CALCIUM-MAGNESIUM-ZINC-D3 PO) Take 1 tablet by mouth daily.   08/02/2018 at Unknown time  . Multiple Vitamin (MULTIVITAMIN WITH MINERALS) TABS tablet Take 1 tablet by mouth daily.   08/02/2018 at Unknown time  . pilocarpine (SALAGEN) 5 MG tablet Take by mouth 2 (two) times daily.   08/08/2018 at Unknown time   Scheduled: . albuterol  2.5 mg Nebulization TID  . atorvastatin  10 mg Oral QHS  . clopidogrel  75 mg Oral Daily  . colchicine  0.6 mg Oral Daily  . [START ON 08/04/2018] Dimethyl Fumarate  1 capsule Oral Daily  . FLUoxetine  40 mg Oral BID  . guaiFENesin  600 mg Oral BID  . heparin  5,000 Units Subcutaneous Q8H  . ibuprofen  600 mg Oral BID  . [START ON 08/04/2018] methylphenidate  10 mg Oral BID WC  . multivitamin with minerals  1 tablet Oral Daily  . pantoprazole  40 mg Oral Daily  . pilocarpine  5 mg Oral BID  . sodium chloride flush  3 mL Intravenous Q12H   Continuous: . sodium chloride 1,000 mL (07/14/2018 1100)  . sodium chloride    . sodium chloride    . [START ON 08/04/2018] azithromycin    . [START ON 08/04/2018] cefTRIAXone (ROCEPHIN)  IV    . [START ON 08/04/2018] vancomycin     UJW:JXBJYNPRN:sodium chloride, acetaminophen **OR** acetaminophen, albuterol, baclofen, ondansetron **OR** ondansetron  (ZOFRAN) IV, polyethylene glycol, sodium chloride flush, traZODone  Allergies  Allergen Reactions  . Sulfa Antibiotics     Reaction unknown     ROS:  A comprehensive review of systems was negative except for: Constitutional: positive for anorexia, chills and fevers Respiratory: positive for shortness of breath Musculoskeletal: positive for pressure sores on the sacrum and hips and ankle  Blood pressure (!) 97/59, pulse (!) 108, temperature 99.7 F (37.6 C), temperature source Rectal, resp. rate (!) 22, height 5\' 4"  (1.626 m), weight 61.5 kg, SpO2 93 %. Physical Exam Constitutional:      Appearance: She is cachectic.  HENT:     Head: Normocephalic.     Mouth/Throat:     Mouth: Mucous membranes are moist.  Eyes:     Extraocular Movements: Extraocular movements intact.     Pupils: Pupils are equal, round, and reactive to light.  Pulmonary:     Effort: Pulmonary effort is normal.     Breath sounds: Decreased air movement present.  Abdominal:     General: There is no distension.     Palpations: Abdomen is soft.     Tenderness: There is no abdominal tenderness.  Musculoskeletal:     Right shoulder: She exhibits decreased range of motion.     Left shoulder: She exhibits decreased range of motion.     Right hip: She exhibits decreased range of motion.     Left hip: She exhibits decreased range of motion and decreased strength.     Right lower leg: No edema.     Comments: Contractures of lower extremities and thinning / frail limbs; right and left hip with dry eschar; right lateral ankle with stage IV pressure sore, some drainage, down to the bone, sacral wound down stage IV, to the bone, necrotic tissue, significant undermining 3cm circumferentially, 3cm deep, opening 7cm   Skin:    General: Skin is warm.  Neurological:     Mental Status: She is alert and oriented to person, place, and time.     Motor: Weakness present.  Psychiatric:        Mood and Affect: Mood normal.         Behavior: Behavior normal.     Results: Results for orders placed or performed during the hospital encounter of 07/23/2018 (from the past 48 hour(s))  Group A Strep by PCR     Status: None   Collection Time: 07/28/2018 10:55 AM  Result Value Ref Range   Group A Strep by PCR NOT DETECTED NOT DETECTED  Comment: Performed at [facility], [address] [place], [place] [address]  Urinalysis, Routine w reflex microscopic (not at [facility])     Status: Abnormal   Collection Time: 08/04/2018 11:05 AM  Result Value Ref Range   Color, Urine AMBER (A) YELLOW    Comment: BIOCHEMICALS MAY BE AFFECTED BY COLOR   APPearance CLOUDY (A) CLEAR   Specific Gravity, Urine 1.031 (H) 1.005 - 1.030   pH 5.0 5.0 - 8.0   Glucose, UA NEGATIVE NEGATIVE mg/dL   Hgb urine dipstick NEGATIVE NEGATIVE   Bilirubin Urine SMALL (A) NEGATIVE   Ketones, ur NEGATIVE NEGATIVE mg/dL   Protein, ur 650 (A) NEGATIVE mg/dL   Nitrite NEGATIVE NEGATIVE   Leukocytes, UA NEGATIVE NEGATIVE   RBC / HPF 0-5 0 - 5 RBC/hpf   WBC, UA 6-10 0 - 5 WBC/hpf   Bacteria, UA NONE SEEN NONE SEEN   Squamous Epithelial / LPF 0-5 0 - 5   Mucus PRESENT    Hyaline Casts, UA PRESENT    Non Squamous Epithelial 0-5 (A) NONE SEEN    Comment: Performed at [facility], [address] [place], [place] [address]  Comprehensive metabolic panel     Status: Abnormal   Collection Time: 07/17/2018 11:18 AM  Result Value Ref Range   Sodium 139 135 - 145 mmol/L   Potassium 3.7 3.5 - 5.1 mmol/L   Chloride 103 98 - 111 mmol/L   CO2 27 22 - 32 mmol/L   Glucose, Bld 128 (H) 70 - 99 mg/dL   BUN 27 (H) 6 - 20 mg/dL   Creatinine, Ser 6.81 0.44 - 1.00 mg/dL   Calcium 7.4 (L) 8.9 - 10.3 mg/dL   Total Protein 5.3 (L) 6.5 - 8.1 g/dL   Albumin 1.7 (L) 3.5 - 5.0 g/dL   AST 275 (H) 15 - 41 U/L   ALT 96 (H) 0 - 44 U/L   Alkaline Phosphatase 171 (H) 38 - 126 U/L   Total Bilirubin 0.8 0.3 - 1.2 mg/dL   GFR calc non Af Amer >60 >60 mL/min   GFR calc Af Amer >60 >60 mL/min    Anion gap 9 5 - 15    Comment: Performed at [facility], [address], [place], [place] [address]  CBC WITH DIFFERENTIAL     Status: Abnormal   Collection Time: 07/26/2018 11:18 AM  Result Value Ref Range   WBC 40.0 (H) 4.0 - 10.5 K/uL   RBC 3.51 (L) 3.87 - 5.11 MIL/uL   Hemoglobin 8.8 (L) 12.0 - 15.0 g/dL   HCT 74.9 (L) 44.9 - 67.5 %   MCV 82.9 80.0 - 100.0 fL   MCH 25.1 (L) 26.0 - 34.0 pg   MCHC 30.2 30.0 - 36.0 g/dL   RDW 91.6 38.4 - 66.5 %   Platelets 476 (H) 150 - 400 K/uL   nRBC 0.1 0.0 - 0.2 %   Neutrophils Relative % 89 %   Neutro Abs 35.4 (H) 1.7 - 7.7 K/uL   Lymphocytes Relative 3 %   Lymphs Abs 1.2 0.7 - 4.0 K/uL   Monocytes Relative 5 %   Monocytes Absolute 2.1 (H) 0.1 - 1.0 K/uL   Eosinophils Relative 0 %   Eosinophils Absolute 0.0 0.0 - 0.5 K/uL   Basophils Relative 0 %   Basophils Absolute 0.1 0.0 - 0.1 K/uL   WBC Morphology WHITE CELL COUNT CONFIRMED BY SMEAR    Immature Granulocytes 3 %   Abs Immature Granulocytes 1.16 (H) 0.00 -  0.07 K/uL    Comment: Performed at [facility], [address], [place], [place] [address]  Troponin I - Now Then Q6H     Status: Abnormal   Collection Time: 07/23/2018 11:20 AM  Result Value Ref Range   Troponin I 0.04 (HH) <0.03 ng/mL    Comment: CRITICAL RESULT CALLED TO, READ BACK BY AND VERIFIED WITH: [facility] AT 12:15PM ON 08/04/2018 BY [facility] Performed at [facility], [address], [place], [place] [address]   Sedimentation rate     Status: Abnormal   Collection Time: 08/07/2018 11:20 AM  Result Value Ref Range   Sed Rate 83 (H) 0 - 22 mm/hr    Comment: Performed at [facility], [address], [place], [place] [address]  Brain natriuretic peptide     Status: Abnormal   Collection Time: 07/18/2018 11:20 AM  Result Value Ref Range   B Natriuretic Peptide 302.0 (H) 0.0 - 100.0 pg/mL    Comment: Performed at [facility], [address], [place], [place] [address]  Lactic acid, plasma     Status: None   Collection  Time: 07/27/2018  1:52 PM  Result Value Ref Range   Lactic Acid, Venous 1.3 0.5 - 1.9 mmol/L    Comment: Performed at [facility], [address], [place], [place] [address]    Dg Chest Port 1 View  Result Date: 08/06/2018 CLINICAL DATA:  Shortness of breath and fever EXAM: PORTABLE CHEST 1 VIEW COMPARISON:  March 02, 2014 FINDINGS: There is cardiomegaly with pulmonary vascular congestion. There is interstitial edema diffusely. There is patchy airspace consolidation in the left lower lobe region. No adenopathy evident. No bone lesions. IMPRESSION: Cardiomegaly with pulmonary vascular congestion and interstitial edema. Suspect a degree of congestive heart failure. Question pneumonia versus alveolar edema in the left base. Both entities may exist concurrently. No adenopathy demonstrated. Electronically Signed   By: Bretta Bang III M.D.   On: 07/14/2018 10:50     Assessment & Plan:  ARCHITA CATHELL is a 58 y.o. 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 She has a WBC to 40 and fevers.  She has decreased sensation and we opted to bedside debridement.  Discussed that this wound is going to be chronic and she has a very low likelihood of healing this wound. The wound is stage IV and bone is exposed and this will mean she has osteomyelitis clinically. We discussed that good turning, air bed, and dressings will be important. We also discussed that nutrition is key for healing, and protein nutrition is important.  I do not think it is likely that she will ever heal the wound and will require constant care. Discussed risk of bedside debridement including but not limited to bleeding, especially given the plavix. Will keep a pressure wound/ packing in the area overnight to reduce bleeding.  I do not think she will need operative debridement but will do serial bedside debridement.   -Will start dressing changes tomorrow to sacrum -BID santyl to the right ankle with damp  to dry gauze changes  -Nutrition support, protein calories needed/ Ensure  -Needs to offload heels and frequent turns, air bed  -Would see what resources she can get at home for a hospital bed   Procedure:  Pre procedure diagnosis: Un-staged sacral wound Post- procedure diagnosis: Stage IV sacral wound   Procedure: Sharp debridement of the sacral wound measuring 7cm long X 3cm wideX3cm deep with 3cm of undermining  circumferentially.    Description: The area was palpated and necrotic tissue was covering the wound. Using a knife, the area was opened and sharp debridement was performed with a scalpel and scissors.  All necrotic tissue and fibrinous tissue was removed. The wound was down to the bone with exposed bone.  The debridement was performed to bleeding tissue.  The wound was packed with Kerlix and ABD and paper tape was placed over the wound.   All questions were answered to the satisfaction of the patient and family.   Lucretia Roers 08-27-2018, 6:22 PM

## 2018-08-03 NOTE — ED Provider Notes (Signed)
[facility] EMERGENCY DEPARTMENT Provider Note   CSN: [id number] Arrival date & time: 08/04/2018  1000     History   Chief Complaint Chief Complaint  Patient presents with  . Shortness of Breath    HPI Laurie Collier is a 58 y.o. female.  Patient is a 58 year old female who presents to the emergency department with her husband because of shortness of breath and fever. Patient has a history of multiple sclerosis.  Patient has a history of cerebrovascular accident with left upper and lower extremity weakness.  History of multiple bedsores.  Patient does not walk at baseline.  The husband is the primary historian, and states that this problem started getting worse on yesterday January 22.  The patient seemed to be more short of breath at rest.  The temperature had gone up.  The patient received Tylenol.  Family noted increasing wheezing.  Temperature maximum was 102.  The patient has been going from sweats to shivers.  It is of note that the patient has multiple bedsores and is being treated through the wound care center for this.  It seems as though that some of the bedsores seem to be getting worse recently instead of better according to the family.  No vomiting reported.  No unusual diarrhea reported.  Patient presents now for assistance with this issue.  The history is provided by the patient and the spouse.    Past Medical History:  Diagnosis Date  . Multiple sclerosis (HCC)   . Stroke [facility](HCC)    2008 with residual LUE/LLE weakness and visual disturbance    Patient Active Problem List   Diagnosis Date Noted  . UTI (lower urinary tract infection) 03/02/2014  . Acute encephalopathy 03/02/2014  . Sleep hypopnea 03/02/2014    Past Surgical History:  Procedure Laterality Date  . bladder pacemaker    . CESAREAN SECTION    . CHOLECYSTECTOMY    . PAIN PUMP IMPLANTATION    . TONSILLECTOMY AND ADENOIDECTOMY    . TUBAL LIGATION       OB History    Gravida  1   Para  1   Term      Preterm      AB      Living  1     SAB      TAB      Ectopic      Multiple      Live Births               Home Medications    Prior to Admission medications   Medication Sig Start Date End Date Taking? Authorizing Provider  atorvastatin (LIPITOR) 10 MG tablet Take 10 mg by mouth at bedtime.    [provider]  baclofen (LIORESAL) 10 MG tablet Take 10 mg by mouth 3 (three) times daily as needed (uses if the pump isn't working or she becomes stiff).    [provider]  BACLOFEN IT by Intrathecal route continuous. Baclofen pump    [provider]  cefpodoxime (VANTIN) 200 MG tablet Take 200 mg by mouth 2 (two) times daily.    [provider]  clopidogrel (PLAVIX) 75 MG tablet Take 75 mg by mouth daily.    [provider]  Dimethyl Fumarate (TECFIDERA) 240 MG CPDR Take by mouth.    [provider]  FLUoxetine (PROZAC) 40 MG capsule Take 40 mg by mouth 2 (two) times daily.    [provider]  methylphenidate (RITALIN) 10 MG  tablet Take 10 mg by mouth 2 (two) times daily with breakfast and lunch.    [provider]  Multiple Vitamin (MULTIVITAMIN WITH MINERALS) TABS tablet Take 1 tablet by mouth daily.    [provider]  nitrofurantoin (MACRODANTIN) 100 MG capsule Take 100 mg by mouth 4 (four) times daily.    [provider]  nystatin (MYCOSTATIN) 100000 UNIT/ML suspension Take 5 mLs by mouth 4 (four) times daily.    [provider]  pilocarpine (SALAGEN) 5 MG tablet Take by mouth 2 (two) times daily.    [provider]  Potassium Gluconate 595 MG CAPS Take by mouth.    [provider]  traMADol-acetaminophen (ULTRACET) 37.5-325 MG tablet Take 1 tablet by mouth every 6 (six) hours as needed.    [provider]  ZOLPIDEM TARTRATE PO Take 6.25 mg by mouth at bedtime.     [provider]    Family History Family History  Problem  Relation Age of Onset  . Cancer Father   . Diabetes Mother   . Leukemia Paternal Grandmother   . Leukemia Maternal Grandmother   . CAD Maternal Grandfather   . Heart attack Maternal Grandfather     Social History Social History   Tobacco Use  . Smoking status: Never Smoker  . Smokeless tobacco: Never Used  Substance Use Topics  . Alcohol use: No  . Drug use: No     Allergies   Sulfa antibiotics   Review of Systems Review of Systems  Constitutional: Positive for chills and fever.  Respiratory: Positive for shortness of breath and wheezing.   Cardiovascular: Negative for chest pain.  Gastrointestinal: Negative for abdominal pain, blood in stool, diarrhea and vomiting.  Skin: Positive for wound.       Multiple bedsores  Neurological: Positive for weakness.       Left upper and lower extremity weakness from previous cerebrovascular accident.     Physical Exam Updated Vital Signs BP (!) 90/31 (BP Location: Left Arm)   Temp 99.7 F (37.6 C) (Rectal)   Resp (!) 26   Ht 5\' 4"  (1.626 m)   Wt 61.5 kg   SpO2 95%   BMI 23.27 kg/m   Physical Exam Vitals signs and nursing note reviewed.  Constitutional:      Appearance: She is well-developed. She is not toxic-appearing.  HENT:     Head: Normocephalic.     Right Ear: Tympanic membrane and external ear normal.     Left Ear: Tympanic membrane and external ear normal.  Eyes:     General: Lids are normal.     Pupils: Pupils are equal, round, and reactive to light.  Neck:     Musculoskeletal: Normal range of motion and neck supple.     Vascular: No carotid bruit.  Cardiovascular:     Rate and Rhythm: Normal rate and regular rhythm.     Pulses: Normal pulses.     Heart sounds: Normal heart sounds.  Pulmonary:     Effort: Tachypnea present. No respiratory distress.     Breath sounds: Examination of the right-lower field reveals decreased breath sounds. Examination of the left-lower field reveals decreased breath  sounds. Decreased breath sounds, wheezing and rhonchi present.  Abdominal:     General: Bowel sounds are normal.     Palpations: Abdomen is soft.     Tenderness: There is no abdominal tenderness. There is no guarding.  Musculoskeletal: Normal range of motion.  Lymphadenopathy:  Head:     Right side of head: No submandibular adenopathy.     Left side of head: No submandibular adenopathy.     Cervical: No cervical adenopathy.  Skin:    General: Skin is warm and dry.     Comments: Patient has a stage IV ulcer of the coccyx area.  Stage II-3 of the left hip.  There is a stage II at the right ankle.  The area at the left hip is warm but not hot.  There is no red streaks noted of any of the ulcer areas.  Neurological:     Mental Status: She is alert and oriented to person, place, and time.     Cranial Nerves: No cranial nerve deficit.     Sensory: No sensory deficit.     Comments: Left upper and lower extremity weakness from previous cerebrovascular accident.  Psychiatric:        Speech: Speech normal.      ED Treatments / Results  Labs (all labs ordered are listed, but only abnormal results are displayed) Labs Reviewed - No data to display  EKG None  Radiology No results found.  Procedures Procedures (including critical care time)  CRITICAL CARE Performed by: Ivery Quale Total critical care time: **30* minutes Critical care time was exclusive of separately billable procedures and treating other patients. Critical care was necessary to treat or prevent imminent or life-threatening deterioration. Critical care was time spent personally by me on the following activities: development of treatment plan with patient and/or surrogate as well as nursing, discussions with consultants, evaluation of patient's response to treatment, examination of patient, obtaining history from patient or surrogate, ordering and performing treatments and interventions, ordering and review of  laboratory studies, ordering and review of radiographic studies, pulse oximetry and re-evaluation of patient's condition. Medications Ordered in ED Medications - No data to display   Initial Impression / Assessment and Plan / ED Course  I have reviewed the triage vital signs and the nursing notes.  Pertinent labs & imaging results that were available during my care of the patient were reviewed by me and considered in my medical decision making (see chart for details).       Final Clinical Impressions(s) / ED Diagnoses MDM  Temperature is 98.4 orally, and 99.7 rectally.  Pulse rate is elevated from 106.  The respiratory rate is elevated at 26.  The blood pressure is low at 90/31.  The pulse oximetry is 95 to 97% on room oxygen mask. Code sepsis called. Patient seen with me by Dr. Jacqulyn Bath..  The electrocardiogram shows some ST changes.  This was reviewed with Dr. Oneta Rack.  The pattern is consistent with possible pericarditis.  Dr. Wyline Mood will evaluate the patient in the emergency department.  The comprehensive metabolic panel shows the glucose to be slightly elevated at 128, the BUN is elevated at 27, the albumin is low at 1.7, the liver function studies showed the AST elevated at 131 the ALT elevated at 96, the alkaline phosphatase elevated at 171.  The total bilirubin is normal at 0.8.  The anion gap is normal at 9.  The complete blood count shows the white blood cells to be elevated at 40,000.  The hemoglobin is low at 8.8, the hematocrit is low at 29.1. Urine analysis shows a cloudy amber specimen with a specific gravity 1.031.  There are hyaline cast present.  Nitrates and leukocyte esterase are both negative.  There is 0-5 red cells and 6-10 white  blood cells.  A culture has been obtained. Chest x-ray shows cardiomegaly with pulmonary vascular congestion and interstitial edema.  There is question of a degree of congestive heart failure.  There is also question of pneumonia  versus alveolar edema in the left lung base.  Patient started on IV Rocephin, Zithromax, and vancomycin.  Cardiology consultation included in the medical record.  I discussed the case with the patient and the patient's husband in terms which they understand.  Questions were answered.  Call placed to triad hospitalist for admission.  Pt to be admitted.    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

## 2018-08-03 NOTE — Progress Notes (Signed)
*  PRELIMINARY RESULTS* Echocardiogram 2D Echocardiogram has been performed.  Laurie Collier Aug 07, 2018, 1:59 PM

## 2018-08-03 NOTE — Consult Note (Signed)
Cardiology Consultation:   Patient ID: Laurie Collier MRN: [id number]; DOB: 06-Jul-1961  Admit date: 08/02/2018 Date of Consult: 07/12/2018  Primary Care Provider: Sharilyn Sites, MD Primary Cardiologist: none Primary Electrophysiologist:  None   Patient Profile:   Laurie Collier is a 58 y.o. female with a hx of multipe sclerosis who is being seen today for the evaluation of abnormal EKG at the request of Dr. Laverta Baltimore.   History of Present Illness:   Laurie Collier 58 yo female history of multiple sclerosis, CVA presents with SOB and fevers. Cardiology is consulted for abnormal EKG. Only chest pain she has is with position changes, her family picks her up to transfer her due to her MS. Denies any other chest pain symptoms.    WBC 40 Hgb 8.8 Plt 476 K 3.7 Cr 0.51 AST 131 ALT 96 Alk phos 171 ESR 83 Trop 0.04 CXR cardiomegaly with edema vs pneumonia.  EKG SR, diffuse ST depressions and PR depression, PR elevation aVR -Echo normal LVEF, no WMAs, mild pericardial effusion   Past Medical History:  Diagnosis Date  . Multiple sclerosis ([place])   . Stroke [facility])    2008 with residual LUE/LLE weakness and visual disturbance    Past Surgical History:  Procedure Laterality Date  . bladder pacemaker    . CESAREAN SECTION    . CHOLECYSTECTOMY    . PAIN PUMP IMPLANTATION    . TONSILLECTOMY AND ADENOIDECTOMY    . TUBAL LIGATION       Inpatient Medications: Scheduled Meds:  Continuous Infusions: . sodium chloride 1,000 mL (07/20/2018 1100)  . sodium chloride    . [START ON 08/04/2018] azithromycin    . [START ON 08/04/2018] cefTRIAXone (ROCEPHIN)  IV    . vancomycin 1,500 mg (07/16/2018 1303)  . [START ON 08/04/2018] vancomycin     PRN Meds:   Allergies:    Allergies  Allergen Reactions  . Sulfa Antibiotics     Reaction unknown    Social History:   Social History   Socioeconomic History  . Marital status: Married    Spouse name: Not on file  . Number of children: Not on  file  . Years of education: Not on file  . Highest education level: Not on file  Occupational History  . Not on file  Social Needs  . Financial resource strain: Not on file  . Food insecurity:    Worry: Not on file    Inability: Not on file  . Transportation needs:    Medical: Not on file    Non-medical: Not on file  Tobacco Use  . Smoking status: Never Smoker  . Smokeless tobacco: Never Used  Substance and Sexual Activity  . Alcohol use: No  . Drug use: No  . Sexual activity: Yes    Birth control/protection: Surgical    Comment: tubal  Lifestyle  . Physical activity:    Days per week: Not on file    Minutes per session: Not on file  . Stress: Not on file  Relationships  . Social connections:    Talks on phone: Not on file    Gets together: Not on file    Attends religious service: Not on file    Active member of club or organization: Not on file    Attends meetings of clubs or organizations: Not on file    Relationship status: Not on file  . Intimate partner violence:    Fear of current or ex partner: Not on file  Emotionally abused: Not on file    Physically abused: Not on file    Forced sexual activity: Not on file  Other Topics Concern  . Not on file  Social History Narrative  . Not on file    Family History:    Family History  Problem Relation Age of Onset  . Cancer Father   . Diabetes Mother   . Leukemia Paternal Grandmother   . Leukemia Maternal Grandmother   . CAD Maternal Grandfather   . Heart attack Maternal Grandfather      ROS:  Please see the history of present illness.  All other ROS reviewed and negative.     Physical Exam/Data:   Vitals:   08/01/2018 1146 07/16/2018 1200 07/31/2018 1215 07/30/2018 1305  BP:    (!) 85/61  Pulse:  94 94 90  Resp:  17 18 (!) 21  Temp:      TempSrc:      SpO2: 97% 96% 96% 97%  Weight:      Height:        Intake/Output Summary (Last 24 hours) at 07/13/2018 1350 Last data filed at 07/31/2018 1306 Gross  per 24 hour  Intake 1350 ml  Output -  Net 1350 ml   Last 3 Weights 07/18/2018 03/02/2014 03/02/2014  Weight (lbs) 135 lb 9.3 oz 139 lb 15.9 oz 140 lb  Weight (kg) 61.5 kg 63.5 kg 63.504 kg     Body mass index is 23.27 kg/m.  General:  Ill appearing,  HEENT: normal Lymph: no adenopathy Neck: no JVD Endocrine:  No thryomegaly Cardiac:  normal S1, S2; RRR; no murmur  Lungs:  Coarse bilaterally Abd: soft, nontender, no hepatomegaly  Ext: no edema Musculoskeletal:  No deformities, BUE and BLE strength normal and equal Skin: warm and dry  Neuro:  CNs 2-12 intact, no focal abnormalities noted Psych:  Normal affect    Laboratory Data:  Chemistry Recent Labs  Lab 07/22/2018 1118  NA 139  K 3.7  CL 103  CO2 27  GLUCOSE 128*  BUN 27*  CREATININE 0.51  CALCIUM 7.4*  GFRNONAA >60  GFRAA >60  ANIONGAP 9    Recent Labs  Lab 07/17/2018 1118  PROT 5.3*  ALBUMIN 1.7*  AST 131*  ALT 96*  ALKPHOS 171*  BILITOT 0.8   Hematology Recent Labs  Lab 07/22/2018 1118  WBC 40.0*  RBC 3.51*  HGB 8.8*  HCT 29.1*  MCV 82.9  MCH 25.1*  MCHC 30.2  RDW 14.5  PLT 476*   Cardiac Enzymes Recent Labs  Lab 07/20/2018 1120  TROPONINI 0.04*   No results for input(s): TROPIPOC in the last 168 hours.  BNPNo results for input(s): BNP, PROBNP in the last 168 hours.  DDimer No results for input(s): DDIMER in the last 168 hours.  Radiology/Studies:  Dg Chest Port 1 View  Result Date: 07/18/2018 CLINICAL DATA:  Shortness of breath and fever EXAM: PORTABLE CHEST 1 VIEW COMPARISON:  March 02, 2014 FINDINGS: There is cardiomegaly with pulmonary vascular congestion. There is interstitial edema diffusely. There is patchy airspace consolidation in the left lower lobe region. No adenopathy evident. No bone lesions. IMPRESSION: Cardiomegaly with pulmonary vascular congestion and interstitial edema. Suspect a degree of congestive heart failure. Question pneumonia versus alveolar edema in the left base.  Both entities may exist concurrently. No adenopathy demonstrated. Electronically Signed   By: Lowella Grip III M.D.   On: 07/25/2018 10:50    Assessment and Plan:   1. Abnormal EKG -  EKG consistent with pericarditis. Diffuse upsloping ST elevations with diffuse PR depression, PR elevatoin aVR, no reciprical ST/T changes. ST is isoelectric to TP segment. - patient without chest pain. Presented with fever, SOB, WBC up to 40. Elevated ESR - echo normal LVEF no WMAs, mild pericardial effusion - very slight trop just above the level of detection at 0.04  - start ibuprofen 684m bid. Wt <70kg, start colchicine 0.616mdaily.    2. Sepsis - per primary team  3. SOB - probable respiraotry infection as etiology - despite CXR report I don't think she is volume overloaded based on exam. Probable diffuse infiltrates from atypical pneumonia, would not plan on diuresing. Fairly benign echo, small IVC suggests hypovolemia       For questions or updates, please contact [facility] consult [email or web address] for contact info under     Signed, BrCarlyle DollyMD  07/19/2018 1:50 PM

## 2018-08-03 NOTE — ED Notes (Signed)
Date and time results received: 08/02/2018 1215 (use smartphrase ".now" to insert current time)  Test: trop Critical Value: 0.04  Name of Provider Notified: long  Orders Received? Or Actions Taken?: see chart

## 2018-08-03 NOTE — ED Triage Notes (Signed)
SOB started yesterday, fever off and on.

## 2018-08-03 NOTE — Consult Note (Signed)
WOC Nurse wound consult note I spoke with the primary RN, Thayer Ohm in the [facility] ED room 07.  He stated the patient is undergoing many diagnostic tests today and he is recommending to wait until tomorrow morning to perform the wound consult. Helmut Muster, RN, MSN, CWOCN, CNS-BC, pager [phone]

## 2018-08-03 NOTE — Progress Notes (Signed)
[facility]  Bedside debridement went well. Full note to follow. Keep wound packed and dressing in place. Reinforce as needed.   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

## 2018-08-03 NOTE — H&P (Addendum)
Patient Demographics:    Laurie Collier, is a 58 y.o. female  MRN: [id number]   DOB - [birth year]  Admit Date - 07/26/2018  Outpatient Primary MD for the patient is Sharilyn Sites, MD   Assessment & Plan:    Principal Problem:   Sepsis due to infected decubitus ulcers Active Problems:   Lower urinary tract infectious disease   Multiple Decubital ulcers   Left hemiparesis---Prior CVA in 2008 with residual LUE/LLE weakness and visual disturbance   Multiple sclerosis/[place]   Leukocytosis>--- WBC 40 K   Pericarditis   PNA (pneumonia)    1)Sepsis secondary to infected decubitus ulcers with concomitant and Pneumonia----white count 40 K, ESR 83, patient had tachycardia, fevers and shortness of breath, treat empirically with IV Vanco and Rocephin pending blood and wound cultures, as well as urine culture  2)Infected Decubitus Ulcers--patient's sacral coccygeal decubitus ulcer with loss of necrotic tissue is about 9 cm deep, 7 cm long and 4 cm wide (see photos below)--- status post bedside debridement by general surgeon Dr. Constance Haw, IV Vanco and Rocephin as above #1, ESR is 83, suspect/presume osteomyelitis, patient also has left hip and right heel decubitus ulcers  3) severe protein caloric malnutrition--- dietitian consult requested, give nutritional supplements to promote wound healing  4) pneumonia--- white count 40,000 on intermittent fevers, shortness of breath, treat empirically with IV azithromycin and Rocephin as well as bronchodilators and mucolytics  5)Pericarditis--- Pt without chest pains, fevers and elevated ESR noted as above #1 abnormal EKG noted, cardiology consult from Dr. Harl Bowie appreciated, treat empirically with ibuprofen 600 3 times daily and colchicine 0.6 mg daily, echocardiogram with preserved  EF of 60 to 65% with mild pericardial effusion, no wall motion normalities,--please see EKG description below - EKG consistent with pericarditis. Diffuse upsloping ST elevations with diffuse PR depression, PR elevatoin aVR, no reciprical ST/T changes. ST is isoelectric to TP segment.  6) chronic pain syndrome--- patient has implanted baclofen pump  7) multiple sclerosis--- at baseline patient is wheelchair-bound, patient is total care  8)Social/Ethics--plan of care discussed with husband at bedside, patient and husband request DNR/DNI status  9) history of prior stroke in 2008 with left-sided hemiparesis--- continue Lipitor 10 mg daily Plavix 75 mg daily  Prophylaxis--Protonix for GI prophylaxis while on ibuprofen for pericarditis   With History of - Reviewed by me  Past Medical History:  Diagnosis Date  . Multiple sclerosis ([place])   . Stroke [facility])    2008 with residual LUE/LLE weakness and visual disturbance      Past Surgical History:  Procedure Laterality Date  . bladder pacemaker    . CESAREAN SECTION    . CHOLECYSTECTOMY    . PAIN PUMP IMPLANTATION    . TONSILLECTOMY AND ADENOIDECTOMY    . TUBAL LIGATION        Chief Complaint  Patient presents with  . Shortness of Breath      HPI:   Laurie Collier  is a 58 y.o. female history of debilitating multiple sclerosis, wheelchair-bound at baseline, history of prior stroke in 2008 with left-sided hemiparesis as well as decubitus ulcers who presents to the ED with on and off fevers for about 4 days  Upon entering patient's room in the ED there is a strong foul smelling odor--retrospect this is from a decubitus backslash with lots of necrotic tissue  Additional history obtained from patient's husband and daughter-in-law at bedside  Patient did have some shortness of breath, but no chest pains, no vomiting or diarrhea  In ED--- EKG was found to be abnormal---Abnormal EKG - EKG consistent with pericarditis. Diffuse  upsloping ST elevations with diffuse PR depression, PR elevatoin aVR, no reciprical ST/T changes. ST is isoelectric to TP segment. Troponin is only 0.04, again patient is chest pain-free  EKG was reviewed by on-call cardiologist Dr. Harl Bowie  In ED--chest x-ray suggestive of pneumonia,  patient is found to have leukocytosis of 40 K, group A strep was negative, ESR is 83 and lactic acid is only 1.3  Patient received IV antibiotics, surgical consult from Dr. Constance Haw appreciated, patient underwent bedside wound debridement in the ED   Review of systems:    In addition to the HPI above,   A full Review of  Systems was done, all other systems reviewed are negative except as noted above in HPI , .    Social History:  Reviewed by me    Social History   Tobacco Use  . Smoking status: Never Smoker  . Smokeless tobacco: Never Used  Substance Use Topics  . Alcohol use: No     Family History :  Reviewed by me    Family History  Problem Relation Age of Onset  . Cancer Father   . Diabetes Mother   . Leukemia Paternal Grandmother   . Leukemia Maternal Grandmother   . CAD Maternal Grandfather   . Heart attack Maternal Grandfather      Home Medications:   Prior to Admission medications   Medication Sig Start Date End Date Taking? Authorizing Provider  atorvastatin (LIPITOR) 10 MG tablet Take 10 mg by mouth at bedtime.   Yes [provider]  baclofen (LIORESAL) 10 MG tablet Take 10 mg by mouth 3 (three) times daily as needed (uses if the pump isn't working or she becomes stiff).   Yes [provider]  BACLOFEN IT by Intrathecal route continuous. Baclofen pump   Yes [provider]  clopidogrel (PLAVIX) 75 MG tablet Take 75 mg by mouth daily.   Yes [provider]  Dimethyl Fumarate (TECFIDERA) 240 MG CPDR Take 1 capsule by mouth daily.    Yes [provider]  FLUoxetine (PROZAC) 40 MG capsule Take 40 mg by mouth 2 (two) times daily.    Yes [provider]  methylphenidate (RITALIN) 10 MG tablet Take 10 mg by mouth 2 (two) times daily with breakfast and lunch.   Yes [provider]  Multiple Minerals-Vitamins (CALCIUM-MAGNESIUM-ZINC-D3 PO) Take 1 tablet by mouth daily.   Yes [provider]  Multiple Vitamin (MULTIVITAMIN WITH MINERALS) TABS tablet Take 1 tablet by mouth daily.   Yes [provider]  pilocarpine (SALAGEN) 5 MG tablet Take by mouth 2 (two) times daily.   Yes [provider]     Allergies:     Allergies  Allergen Reactions  . Sulfa Antibiotics     Reaction unknown     Physical Exam:   Vitals  Blood pressure Marland Kitchen)  101/49, pulse (!) 110, temperature 99.7 F (37.6 C), temperature source Rectal, resp. rate (!) 23, height '5\' 4"'  (1.626 m), weight 61.5 kg, SpO2 92 %.  Physical Examination: General appearance - alert, chronically ill appearing, and in no distress an Mental status - alert, oriented to person, place, and time,  Eyes - sclera anicteric Neck - supple, no JVD elevation , Chest -diminished bilaterally with few scattered rhonchi in bases  heart - S1 and S2 normal, regular  Abdomen - soft, nontender, nondistended, no masses or organomegaly Neurological - screening mental status exam normal, neck supple without rigidity, left-sided hemiparesis not new, neuro muscular deficits consistent with underlying multiple sclerosis  extremities - no pedal edema noted, intact peripheral pulses  Skin -multiple decubitus ulcers in the sacrum, left hip and right heel--- see photo below patient's sacral coccygeal decubitus ulcer with loss of necrotic tissue is about 9 cm deep, 7 cm long and 4 cm wide (see photos below)-  Media Information   Document Information   Photos    07/12/2018 15:34  Attached To:  Hospital Encounter on 07/26/2018  Source Information   Roxan Hockey, MD  [facility]       Data Review:    CBC Recent Labs  Lab 08/02/2018 1118   WBC 40.0*  HGB 8.8*  HCT 29.1*  PLT 476*  MCV 82.9  MCH 25.1*  MCHC 30.2  RDW 14.5  LYMPHSABS 1.2  MONOABS 2.1*  EOSABS 0.0  BASOSABS 0.1   ------------------------------------------------------------------------------------------------------------------  Chemistries  Recent Labs  Lab 07/17/2018 1118  NA 139  K 3.7  CL 103  CO2 27  GLUCOSE 128*  BUN 27*  CREATININE 0.51  CALCIUM 7.4*  AST 131*  ALT 96*  ALKPHOS 171*  BILITOT 0.8   ------------------------------------------------------------------------------------------------------------------ estimated creatinine clearance is 66.2 mL/min (by C-G formula based on SCr of 0.51 mg/dL). ------------------------------------------------------------------------------------------------------------------ No results for input(s): TSH, T4TOTAL, T3FREE, THYROIDAB in the last 72 hours.  Invalid input(s): FREET3   Coagulation profile No results for input(s): INR, PROTIME in the last 168 hours. ------------------------------------------------------------------------------------------------------------------- No results for input(s): DDIMER in the last 72 hours. -------------------------------------------------------------------------------------------------------------------  Cardiac Enzymes Recent Labs  Lab 08/10/2018 1120 08/08/2018 1723  TROPONINI 0.04* 0.03*   ------------------------------------------------------------------------------------------------------------------    Component Value Date/Time   BNP 302.0 (H) 07/13/2018 1120     ---------------------------------------------------------------------------------------------------------------  Urinalysis    Component Value Date/Time   COLORURINE AMBER (A) 08/02/2018 1105   APPEARANCEUR CLOUDY (A) 07/31/2018 1105   LABSPEC 1.031 (H) 07/12/2018 1105   PHURINE 5.0 07/27/2018 1105   GLUCOSEU NEGATIVE 08/04/2018 1105   HGBUR NEGATIVE 07/16/2018 1105    BILIRUBINUR SMALL (A) 08/10/2018 1105   KETONESUR NEGATIVE 07/28/2018 1105   PROTEINUR 100 (A) 07/28/2018 1105   UROBILINOGEN 0.2 03/02/2014 0821   NITRITE NEGATIVE 07/31/2018 1105   LEUKOCYTESUR NEGATIVE 07/17/2018 1105    ----------------------------------------------------------------------------------------------------------------   Imaging Results:    Dg Chest Port 1 View  Result Date: 08/04/2018 CLINICAL DATA:  Shortness of breath and fever EXAM: PORTABLE CHEST 1 VIEW COMPARISON:  March 02, 2014 FINDINGS: There is cardiomegaly with pulmonary vascular congestion. There is interstitial edema diffusely. There is patchy airspace consolidation in the left lower lobe region. No adenopathy evident. No bone lesions. IMPRESSION: Cardiomegaly with pulmonary vascular congestion and interstitial edema. Suspect a degree of congestive heart failure. Question pneumonia versus alveolar edema in the left base. Both entities may exist concurrently. No adenopathy demonstrated. Electronically Signed   By: Lowella Grip III M.D.  On: 07/20/2018 10:50    Radiological Exams on Admission: Dg Chest Port 1 View  Result Date: 07/18/2018 CLINICAL DATA:  Shortness of breath and fever EXAM: PORTABLE CHEST 1 VIEW COMPARISON:  March 02, 2014 FINDINGS: There is cardiomegaly with pulmonary vascular congestion. There is interstitial edema diffusely. There is patchy airspace consolidation in the left lower lobe region. No adenopathy evident. No bone lesions. IMPRESSION: Cardiomegaly with pulmonary vascular congestion and interstitial edema. Suspect a degree of congestive heart failure. Question pneumonia versus alveolar edema in the left base. Both entities may exist concurrently. No adenopathy demonstrated. Electronically Signed   By: Lowella Grip III M.D.   On: 08/11/2018 10:50    DVT Prophylaxis -SCD  AM Labs Ordered, also please review Full Orders  Family Communication: Admission, patients condition and  plan of care including tests being ordered have been discussed with the patient and husband who indicate understanding and agree with the plan   Code Status - Full Code  Likely DC to  TBD  Condition   stable  Roxan Hockey M.D on 07/22/2018 at 7:03 PM Go to [email or web address] -  for contact info  [facility] - Office  [phone]

## 2018-08-03 NOTE — Progress Notes (Addendum)
Pharmacy Antibiotic Note  Laurie Collier is a 58 y.o. female admitted on 07/29/2018 with pneumonia.  Pharmacy has been consulted for vancomycin  dosing.  Plan: Loading dose:  vancomycin 1.5g IV x1 dose Maintenance dose:  vancomycin 750mg  IV q12h Goal vancomycin trough range: 15-20   mcg/mL Pharmacy will continue to monitor renal function, vancomycin troughs as clinically  appropriate,  cultures and patient progress.    Height: 5\' 4"  (162.6 cm) Weight: 135 lb 9.3 oz (61.5 kg) IBW/kg (Calculated) : 54.7  Temp (24hrs), Avg:99.1 F (37.3 C), Min:98.4 F (36.9 C), Max:99.7 F (37.6 C)  No results for input(s): WBC, CREATININE, LATICACIDVEN, VANCOTROUGH, VANCOPEAK, VANCORANDOM, GENTTROUGH, GENTPEAK, GENTRANDOM, TOBRATROUGH, TOBRAPEAK, TOBRARND, AMIKACINPEAK, AMIKACINTROU, AMIKACIN in the last 168 hours.  CrCl cannot be calculated (Patient's most recent lab result is older than the maximum 21 days allowed.).    Allergies  Allergen Reactions  . Sulfa Antibiotics     Reaction unknown    Antimicrobials this admission: azithromycin 1/23>>   ceftriaxone  1/23 >> vancomycin 1/23 >>    Microbiology results: 1/23 [facility] x2:   1/23 UCx:     1/23 MRSA PCR:   Thank you for allowing pharmacy to be a part of this patient's care.  Tama High 07/17/2018 11:16 AM

## 2018-08-03 NOTE — ED Notes (Signed)
Cardiologist Dr. Wyline Mood at bedside.

## 2018-08-03 NOTE — Progress Notes (Addendum)
EKG discussed with ER staff. Diffuse upsloping ST elevation with diffuse PR depression. In most leads the ST segment actually appears isoelectric to the TP segment and the PR segment appears depressed, there is PR elevation in aVR. No reciprocal ST depressions or TWIs,  Findings most consistent with acute pericarditis and not STEMI. Patient presents with fevers and SOB.    Dominga Ferry MD

## 2018-08-03 NOTE — ED Notes (Signed)
EMS gave 500cc NS bolus prior to arrival

## 2018-08-03 NOTE — ED Notes (Signed)
Patient has multiple bedsores on bottom, right ankle, both hips.

## 2018-08-04 ENCOUNTER — Inpatient Hospital Stay (HOSPITAL_COMMUNITY): Payer: Medicare Other

## 2018-08-04 ENCOUNTER — Encounter (HOSPITAL_COMMUNITY): Payer: Self-pay | Admitting: Radiology

## 2018-08-04 DIAGNOSIS — I319 Disease of pericardium, unspecified: Secondary | ICD-10-CM

## 2018-08-04 DIAGNOSIS — L89154 Pressure ulcer of sacral region, stage 4: Secondary | ICD-10-CM

## 2018-08-04 DIAGNOSIS — A419 Sepsis, unspecified organism: Principal | ICD-10-CM

## 2018-08-04 LAB — CBC
HCT: 30.3 % — ABNORMAL LOW (ref 36.0–46.0)
Hemoglobin: 9.1 g/dL — ABNORMAL LOW (ref 12.0–15.0)
MCH: 25.1 pg — ABNORMAL LOW (ref 26.0–34.0)
MCHC: 30 g/dL (ref 30.0–36.0)
MCV: 83.7 fL (ref 80.0–100.0)
Platelets: 518 10*3/uL — ABNORMAL HIGH (ref 150–400)
RBC: 3.62 MIL/uL — ABNORMAL LOW (ref 3.87–5.11)
RDW: 14.4 % (ref 11.5–15.5)
WBC: 41.7 10*3/uL — ABNORMAL HIGH (ref 4.0–10.5)
nRBC: 0.1 % (ref 0.0–0.2)

## 2018-08-04 LAB — BODY FLUID CELL COUNT WITH DIFFERENTIAL
NEUTROPHIL FLUID: UNDETERMINED % (ref 0–25)
Total Nucleated Cell Count, Fluid: 6779 cu mm — ABNORMAL HIGH (ref 0–1000)

## 2018-08-04 LAB — GRAM STAIN

## 2018-08-04 LAB — BASIC METABOLIC PANEL
Anion gap: 8 (ref 5–15)
BUN: 23 mg/dL — ABNORMAL HIGH (ref 6–20)
CO2: 24 mmol/L (ref 22–32)
Calcium: 7.3 mg/dL — ABNORMAL LOW (ref 8.9–10.3)
Chloride: 105 mmol/L (ref 98–111)
Creatinine, Ser: 0.5 mg/dL (ref 0.44–1.00)
GFR calc non Af Amer: >60 mL/min (ref >60)
Glucose, Bld: 120 mg/dL — ABNORMAL HIGH (ref 70–99)
Potassium: 3.6 mmol/L (ref 3.5–5.1)
Sodium: 137 mmol/L (ref 135–145)

## 2018-08-04 LAB — HIV ANTIBODY (ROUTINE TESTING W REFLEX): HIV Screen 4th Generation wRfx: NONREACTIVE

## 2018-08-04 LAB — BLOOD GAS, ARTERIAL
Acid-base deficit: 1.4 mmol/L (ref 0.0–2.0)
Bicarbonate: 23 mmol/L (ref 20.0–28.0)
FIO2: 80
O2 Saturation: 85.9 %
Patient temperature: 37
pCO2 arterial: 41.9 mmHg (ref 32.0–48.0)
pH, Arterial: 7.363 (ref 7.350–7.450)
pO2, Arterial: 55.9 mmHg — ABNORMAL LOW (ref 83.0–108.0)

## 2018-08-04 LAB — GLUCOSE, CAPILLARY: Glucose-Capillary: 111 mg/dL — ABNORMAL HIGH (ref 70–99)

## 2018-08-04 LAB — LACTATE DEHYDROGENASE: LDH: 361 U/L — ABNORMAL HIGH (ref 98–192)

## 2018-08-04 LAB — LACTATE DEHYDROGENASE, PLEURAL OR PERITONEAL FLUID: LD, Fluid: 4892 U/L — ABNORMAL HIGH (ref 3–23)

## 2018-08-04 LAB — ECHOCARDIOGRAM LIMITED
HEIGHTINCHES: 64 in
Weight: 2169.33 oz

## 2018-08-04 LAB — MRSA PCR SCREENING: MRSA by PCR: NEGATIVE

## 2018-08-04 LAB — TROPONIN I: Troponin I: <0.03 ng/mL (ref <0.03)

## 2018-08-04 LAB — HIGH SENSITIVITY CRP: CRP, High Sensitivity: >300 mg/L — AB (ref 0.00–3.00)

## 2018-08-04 MED ORDER — DIPHENHYDRAMINE HCL 50 MG/ML IJ SOLN: 12.5000 mg | Freq: Every evening | INTRAMUSCULAR | Status: DC | PRN

## 2018-08-04 MED ORDER — DEXTROSE 5 % IV SOLN: INTRAVENOUS | Status: DC

## 2018-08-04 MED ORDER — SODIUM CHLORIDE 0.9 % IV BOLUS
1000.0000 mL | Freq: Once | INTRAVENOUS | Status: AC
  Administered 2018-08-04: 1000 mL via INTRAVENOUS

## 2018-08-04 MED ORDER — DEXTROSE-NACL 5-0.9 % IV SOLN
INTRAVENOUS | Status: DC
  Administered 2018-08-04 – 2018-08-05 (×2): via INTRAVENOUS

## 2018-08-04 MED ORDER — PRO-STAT SUGAR FREE PO LIQD: 30.0000 mL | Freq: Three times a day (TID) | ORAL | Status: DC

## 2018-08-04 MED ORDER — JUVEN PO PACK: 1.0000 | PACK | Freq: Two times a day (BID) | ORAL | Status: DC

## 2018-08-04 MED ORDER — ENSURE ENLIVE PO LIQD: 237.0000 mL | Freq: Three times a day (TID) | ORAL | Status: DC

## 2018-08-04 MED ORDER — PIPERACILLIN-TAZOBACTAM 3.375 G IVPB
3.3750 g | Freq: Three times a day (TID) | INTRAVENOUS | Status: DC
  Administered 2018-08-04 – 2018-08-05 (×3): 3.375 g via INTRAVENOUS
  Filled 2018-08-04 (×3): qty 50

## 2018-08-04 NOTE — Care Management (Signed)
Per [facility] co-pay for Colchicine 0.6 mg daily $37.00 for 30 day supply. NO PA required. Pharmacy : [facility].

## 2018-08-04 NOTE — Progress Notes (Addendum)
Initial Nutrition Assessment  DOCUMENTATION CODES:      INTERVENTION:   Patient is on BiPAP today and per conversation with her nurse is unlikely to be able to be off long enough for po intake today. Will order supplements as follows to start Saturday a.m if patient has been cleared to resume po intake.  -1 packet Juven BID, each packet provides 80 calories, 8 grams of carbohydrate, and 14 grams of amino acids; supplement contains CaHMB, glutamine, and arginine, to promote wound healing   -Provide ProStat 30 ml TID (each 30 ml provides 100 kcal, 15 gr protein)   -Ensure Enlive po BID, each supplement provides 350 kcal and 20 grams of protein   NUTRITION DIAGNOSIS:  Increased nutrient needs related to wound healing as evidenced by estimated needs, meal completion < 25%.   GOAL:  Provide needs based on ASPEN/SCCM guidelines   MONITOR:  Supplement acceptance, PO intake, Labs, Weight trends, Skin(respiratory status)   REASON FOR ASSESSMENT:  Consult Assessment of nutrition requirement/status, Wound healing  ASSESSMENT: Patient is a 58 yo female with hx of MS, multiple non-healing wounds. She presents with sepsis-infected PI, UTI, PNA, leukocytosis (WBC 41.7) . Patient respiratory status declined and she was transferred to ICU-requiring BiPAP.  Lives with husband-and has caregiver assistance. The and son and daughter in law are bedside. Husband provided hx since pt is on BiPAP support.   Nutrition intake: Usually not a picky eater. Had been eating normally up to a week ago. Intake declined day by day prior to admission.   Weight history: stable 62-64 kg   Medications reviewed and include: protonix, prozac, ritalin, MVI, vancomycin, zosyn, azithromycin.   Labs:Hypoalbuminemia  BMP Latest Ref Rng & Units 08/04/2018 08/04/2018 03/03/2014  Glucose 70 - 99 mg/dL 572(I) 203(T) 97  BUN 6 - 20 mg/dL 59(R) 41(U) 13  Creatinine 0.44 - 1.00 mg/dL 3.84 5.36 4.68  Sodium 135 - 145 mmol/L 137  139 143  Potassium 3.5 - 5.1 mmol/L 3.6 3.7 4.3  Chloride 98 - 111 mmol/L 105 103 104  CO2 22 - 32 mmol/L 24 27 31   Calcium 8.9 - 10.3 mg/dL 7.3(L) 7.4(L) 8.8     NUTRITION - FOCUSED PHYSICAL EXAM: deferred due to nature of pt chronic MS disease   Diet Order:   Diet Order            Diet NPO time specified  Diet effective now             EDUCATION NEEDS:   Skin:  Skin Assessment: Skin Integrity Issues: Skin Integrity Issues:: (multiple wounds: stage 2, Unstageable and stage 4)  Last BM:  1/24  Height:   Ht Readings from Last 1 Encounters:  07/28/2018 5\' 4"  (1.626 m)    Weight:   Wt Readings from Last 1 Encounters:  08/08/2018 61.5 kg    Ideal Body Weight:     BMI:  Body mass index is 23.27 kg/m.  Estimated Nutritional Needs:   Kcal:  1800-1975   Protein:  88-99 gr (1.4-1.6 gr/kg/bw)  Fluid:  >1800 ml daily   Royann Shivers MS,RD,CSG,LDN Office: [phone] Pager: [phone]

## 2018-08-04 NOTE — Progress Notes (Signed)
MD paged again about pt's BP, stilling being 80s systolic. MD responded and made changes to fluid orders. New order carried out by this RN. Will continue to monitor.  Quita Skye, RN

## 2018-08-04 NOTE — Procedures (Signed)
PreOperative Dx: LEFT pleural effusion, sepsis Postoperative Dx: LEFT pleural effusion, sepsis Procedure:   US guided LEFT thoracentesis Radiologist:  Tyron Russell Anesthesia:  10 ml of 1% lidocaine Specimen:  800 mL of cloudy brown to reddish colored fluid EBL:   < 1 ml Complications: None

## 2018-08-04 NOTE — Progress Notes (Signed)
On assessment patient O2 sats in the 80's and respiratory rate 25-30. RN increased O2 to 15L HFNC.   MD Emokpae made aware, and RT at bedside. Orders for ABG, BIPAP, chest xray, and transfer to [facility] ICU.   Will continue to monitor. Family at bedside and updated, son is ok with this plan.

## 2018-08-04 NOTE — Progress Notes (Addendum)
Progress Note  Patient Name: Laurie Collier Date of Encounter: 08/04/2018  Primary Cardiologist: Dina RichBranch, Jaeline Whobrey, MD  Subjective   Still SOB, but no chest pain.  Inpatient Medications    Scheduled Meds: . albuterol  2.5 mg Nebulization TID  . atorvastatin  10 mg Oral QHS  . clopidogrel  75 mg Oral Daily  . colchicine  0.6 mg Oral Daily  . Dimethyl Fumarate  1 capsule Oral Daily  . FLUoxetine  40 mg Oral BID  . guaiFENesin  600 mg Oral BID  . heparin  5,000 Units Subcutaneous Q8H  . ibuprofen  600 mg Oral BID  . methylphenidate  10 mg Oral BID WC  . multivitamin with minerals  1 tablet Oral Daily  . pantoprazole  40 mg Oral Daily  . pilocarpine  5 mg Oral BID  . sodium chloride flush  3 mL Intravenous Q12H   Continuous Infusions: . sodium chloride 1,000 mL (07/23/2018 1100)  . sodium chloride 100 mL/hr at 08/04/18 0038  . sodium chloride    . azithromycin    . cefTRIAXone (ROCEPHIN)  IV    . vancomycin 750 mg (08/04/18 0042)   PRN Meds: sodium chloride, acetaminophen **OR** acetaminophen, albuterol, baclofen, ondansetron **OR** ondansetron (ZOFRAN) IV, polyethylene glycol, sodium chloride flush, traZODone   Vital Signs    Vitals:   07/13/2018 2100 08/04/18 0502 08/04/18 0745 08/04/18 0749  BP: 106/70 (!) 99/44    Pulse:  (!) 111    Resp:  (!) 28    Temp:  98.5 F (36.9 C)    TempSrc:  Oral    SpO2:  (!) 82% (!) 82% 93%  Weight:      Height:        Intake/Output Summary (Last 24 hours) at 08/04/2018 0820 Last data filed at 07/27/2018 1543 Gross per 24 hour  Intake 1850 ml  Output -  Net 1850 ml   Last 3 Weights 07/16/2018 03/02/2014 03/02/2014  Weight (lbs) 135 lb 9.3 oz 139 lb 15.9 oz 140 lb  Weight (kg) 61.5 kg 63.5 kg 63.504 kg     Telemetry    NSR - Personally Reviewed  Physical Exam   GEN: Chronically ill frail appearing WM HEENT: Normocephalic, atraumatic, sclera non-icteric. Neck: No JVD or bruits. Cardiac: RRR no murmurs, rubs, or  gallops.  Radials/DP/PT 1+ and equal bilaterally.  Respiratory: Coarse bilaterally, mildly increased WOB GI: Soft, nontender, non-distended, BS +x 4. MS: no deformity. Extremities: No clubbing or cyanosis. No edema. Distal pedal pulses are 2+ and equal bilaterally. Neuro:  AAOx3. Follows commands. Psych:  Responds to questions appropriately with a normal affect.  Labs    Chemistry Recent Labs  Lab 07/28/2018 1118 08/04/18 0457  NA 139 137  K 3.7 3.6  CL 103 105  CO2 27 24  GLUCOSE 128* 120*  BUN 27* 23*  CREATININE 0.51 0.50  CALCIUM 7.4* 7.3*  PROT 5.3*  --   ALBUMIN 1.7*  --   AST 131*  --   ALT 96*  --   ALKPHOS 171*  --   BILITOT 0.8  --   GFRNONAA >60 >60  GFRAA >60 >60  ANIONGAP 9 8     Hematology Recent Labs  Lab 07/22/2018 1118 08/04/18 0457  WBC 40.0* 41.7*  RBC 3.51* 3.62*  HGB 8.8* 9.1*  HCT 29.1* 30.3*  MCV 82.9 83.7  MCH 25.1* 25.1*  MCHC 30.2 30.0  RDW 14.5 14.4  PLT 476* 518*    Cardiac Enzymes  Recent Labs  Lab 2018/08/17 1120 08/17/2018 1723 08-17-2018 2334  TROPONINI 0.04* 0.03* <0.03   No results for input(s): TROPIPOC in the last 168 hours.   BNP Recent Labs  Lab 2018-08-17 1120  BNP 302.0*     DDimer No results for input(s): DDIMER in the last 168 hours.   Radiology    Dg Chest Port 1 View  Result Date: August 17, 2018 CLINICAL DATA:  Shortness of breath and fever EXAM: PORTABLE CHEST 1 VIEW COMPARISON:  March 02, 2014 FINDINGS: There is cardiomegaly with pulmonary vascular congestion. There is interstitial edema diffusely. There is patchy airspace consolidation in the left lower lobe region. No adenopathy evident. No bone lesions. IMPRESSION: Cardiomegaly with pulmonary vascular congestion and interstitial edema. Suspect a degree of congestive heart failure. Question pneumonia versus alveolar edema in the left base. Both entities may exist concurrently. No adenopathy demonstrated. Electronically Signed   By: Bretta Bang III M.D.    On: 2018-08-17 10:50    Patient Profile     59 y.o. female with multiple sclerosis, chronic pain, CVA was admitted with sepsis with infected decubitus ulcers with concomitant UTI and pneumonia. She was also found to have abnormal EKG felt consistent with pericarditis.  Assessment & Plan    1. Sepsis due to the above - being managed by IM, gen surg. High WBC persists - 41k this AM. Resp panel negative.  2. Pericarditis - Dr. Wyline Mood suggested starting ibuprofen 600mg  BID and colchicine 0.6mg  daily. Ibuprofen is typically administered over the course of 7-14 days, and colchicine would be anticipated for 3 months. Will place care management consult for colchicine as this is sometimes quite expensive depending on patient's insurance due to the patent issue a few years back.   3. Small pericardial effusion - follow clinically.  4. SOB - troponins low/flat, EF normal. This is felt due to respiratory issue; echo suggested hypovolemia instead of hypervolemia. Per IM.  For questions or updates, please contact [facility] Please consult [email or web address] for contact info under Cardiology/STEMI.  Signed, Laurann Montana, PA-C 08/04/2018, 8:20 AM    Attending note Patient seen and discussed with PA Dunn, I agree with her documentation above. Managed for sepsis by primary team. EKG consistent with pericarditis, elevated sed rate and small pericardial effusion on echo. Started on ibuprofen 600mg  bid and colchcine 0.6mg  daily, lower doses of bother based on her fairly low body weight. Xray with diffuse interstitial pattern on presentation, she did not appear significnatly volume overloaded by exam. Echo fairly benign, nothing to really serve as a cause for significant HF, suspect xray more related to infectoius process. She is being treated for pneumonia. I think her SOB this AM is more related to her pneumonia, don't see strong indication for aggressive diuresis.  - given her pericarditis and small effusion,  with significant worsening in respiratory status repeat limited echo to reevaluate effusion.    Dina Rich MD

## 2018-08-04 NOTE — Progress Notes (Signed)
Bodenheimer,  [facility] notified B/P 85/35 MAP 51. HR 107, RR 22, Sp02 99 on Bipap.

## 2018-08-04 NOTE — Care Management Important Message (Signed)
Important Message  Patient Details  Name: Laurie Collier MRN: [id number] Date of Birth: February 29, 1961   Medicare Important Message Given:  Yes    Renie Ora 08/04/2018, 3:03 PM

## 2018-08-04 NOTE — Consult Note (Signed)
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  Surgery intends to do first post procedural dressing today due to bleeding risk.   Dry intact eschar to bilateral trochanters. Right lateral malleolus with slough and devitalized tissue to wound bed, ordered Santyl.  ALbumin 1.7 and surgery has discussed with patient and family that healing may not be possible and not the goal for these wounds.  IF Santyl not effective in promoting autolysis, Dakin's solution may aid in debridement and odor management.  Wound type: nonhealing pressure injuries Pressure Injury POA: Yes Measurement:See surgical notes Wound [id number]:[id number] tissue Drainage (amount, consistency, odor) moderate purulence with foul necrotic odor Periwound:intact Dressing procedure/placement/frequency: See surgery orders.  Consider Dakins short term in light of low albumin and overall ability to heal.  Will not follow at this time.  Please re-consult if needed.  Maple Hudson MSN, RN, FNP-BC CWON Wound, Ostomy, Continence Nurse Pager [phone]

## 2018-08-04 NOTE — Progress Notes (Signed)
Pt's BP has been soft at 80s systolic, MD paged but did not make any changes to current treatment at this time. Will continue to monitor.

## 2018-08-04 NOTE — Progress Notes (Signed)
Pt and family concerned that patient will not be able to rest tonight without taking po medications, MD paged to address concern and currently awaiting response.  Quita Skye, RN

## 2018-08-04 NOTE — Progress Notes (Signed)
[facility]  Patient transferred to ICU due to respiratory distress, on BIPAP and had effusion drained. Septic from combination of PNA and decubitus. Dressing changed by RN today, reports some bleeding. H&H stable.  Will reassess wound tomorrow for further debridement. Did a large amount yesterday but leukocytosis continues.  Most of this response likely related to her PNA.    Unlikely to be anything like C dif as she had stool balls evacuated during my debridement.   Algis Greenhouse, MD [facility] [address]ella Raring [place], [place] [address] [phone] (office)

## 2018-08-04 NOTE — Progress Notes (Signed)
*  PRELIMINARY RESULTS* Echocardiogram 2D Echocardiogram LIMITED has been performed.  Jeryl Columbia 08/04/2018, 10:05 AM

## 2018-08-04 NOTE — Progress Notes (Signed)
Patient Demographics:    Laurie Collier, is a 58 y.o. female, DOB - 06-09-1961, JHE:[id number]  Admit date - 08/07/2018   Admitting Physician Kaevion Sinclair Denton Brick, MD  Outpatient Primary MD for the patient is Sharilyn Sites, MD  LOS - 1   Chief Complaint  Patient presents with  . 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 and visual disturbance   Multiple sclerosis/[place]   Leukocytosis>--- WBC 40 K   Pericarditis   PNA (pneumonia)   Sacral decubitus ulcer, stage IV (HCC)  1)Sepsis secondary to infected decubitus ulcers with concomitant  Pneumonia----white count 41 K, ESR 83, pt has worsening tachycardia and tachypnea, hypoxia and increased work of breathing persisted, patient now on continuous BiPAP, transferred to stepdown unit,   continue IV Vanco, stop IV Rocephin and start IV Zosyn  pending blood and wound cultures,   2)Infected Decubitus Ulcers--patient's sacral coccygeal decubitus ulcer with lots of necrotic tissue is about 9 cm deep, 7 cm long and 4 cm wide (see photos )--- status post bedside debridement by general surgeon Dr. Constance Haw on 08/07/2018, continue IV antibiotics as above #1, ESR is 83, suspect/presume osteomyelitis, patient also has left hip and right heel decubitus ulcers  3) pneumonia with left-sided parapneumonic effusion-status post ultrasound-guided thoracentesis on 08/04/2018  with removal of 800 mL of brown to reddish fluid, culture pending, continue IV antibiotics as above #1, BiPAP support as above #1  4)Social/Ethics--plan of care discussed with husband andsonatbedside, patient , her son and husband request DNR/DNI status, however they want full scope of treatment without limitations at this time  5)Pericarditis--- Pt without chest pains, continues to have fevers and elevated ESR noted as above #1 abnormal EKG noted, cardiology consult from Dr. Harl Bowie appreciated, continue ibuprofen 600 3 times daily and colchicine 0.6 mg daily, echocardiogram with preserved EF of 60 to 65% with mild pericardial effusion, no wall motion normalities,--please see EKG description below - EKG consistent with pericarditis. Diffuse upsloping ST elevations with diffuse PR depression, PR elevatoin aVR, no reciprical ST/T changes. ST is isoelectric to TP segment.  6) chronic pain syndrome--- patient has implanted baclofen pump  7) multiple sclerosis--- at baseline patient is wheelchair-bound, patient is total care  8) history of prior stroke in 2008 with left-sided hemiparesis--- continue Lipitor 10 mg daily Plavix 75 mg daily  9) severe protein caloric malnutrition--- dietitian consult requested, c/n  nutritional supplements once patient comes off BiPAP to promote wound healing  Prophylaxis--Protonix for GI prophylaxis while on ibuprofen for pericarditis  Disposition/Need for in-Hospital Stay- patient unable to be discharged at this time due to worsening tachycardia and tachypnea, hypoxia and increased work of breathing persisted, patient now on continuous BiPAP, transferred to stepdown unit,  Code Status : DNR  Family Communication:   Husband and son at bedside   Disposition Plan  : TBD  Consults  :  IR for left-sided ultrasound-guided thoracentesis/general surgery  DVT Prophylaxis  :   SCDs   Lab Results  Component Value Date   PLT 518 (H) 08/04/2018    Inpatient  Medications  Scheduled Meds: . albuterol  2.5 mg Nebulization TID  . atorvastatin  10 mg Oral QHS  . clopidogrel  75 mg Oral Daily  . colchicine  0.6 mg Oral Daily  . Dimethyl Fumarate  1 capsule Oral Daily  . [START ON 08/17/18] feeding supplement (ENSURE ENLIVE)  237 mL Oral TID BM  . [START ON 2018/08/17] feeding supplement (PRO-STAT SUGAR FREE 64)  30 mL Oral TID  . FLUoxetine  40 mg Oral BID  . guaiFENesin  600 mg Oral BID  . heparin  5,000 Units Subcutaneous Q8H  . ibuprofen  600 mg Oral BID  . methylphenidate  10 mg Oral BID WC  . multivitamin with minerals  1 tablet Oral Daily  . [START ON 08/17/18] nutrition supplement (JUVEN)  1 packet Oral BID BM  . pantoprazole  40 mg Oral Daily  . pilocarpine  5 mg Oral BID  . sodium chloride flush  3 mL Intravenous Q12H   Continuous Infusions: . sodium chloride    . dextrose 5 % and 0.9% NaCl    . piperacillin-tazobactam (ZOSYN)  IV 3.375 g (08/04/18 1737)  . vancomycin Stopped (08/04/18 1348)   PRN Meds:.sodium chloride, acetaminophen **OR** acetaminophen, albuterol, baclofen, diphenhydrAMINE, ondansetron **OR** ondansetron (ZOFRAN) IV, polyethylene glycol, sodium chloride flush, traZODone    Anti-infectives (From admission, onward)   Start     Dose/Rate Route Frequency Ordered Stop   08/04/18 1200  azithromycin (ZITHROMAX) 500 mg in sodium chloride 0.9 % 250 mL IVPB  Status:  Discontinued     500 mg 250 mL/hr over 60 Minutes Intravenous Every 24 hours 08/02/2018 1342 08/04/18 1207   08/04/18 1100  cefTRIAXone (ROCEPHIN) 1 g in sodium chloride 0.9 % 100 mL IVPB  Status:  Discontinued     1 g 200 mL/hr over 30 Minutes Intravenous Every 24 hours 07/31/2018 1342 08/04/18 0940   08/04/18 1000  piperacillin-tazobactam (ZOSYN) IVPB 3.375 g     3.375 g 12.5 mL/hr over 240 Minutes Intravenous Every 8 hours 08/04/18 0942     08/04/18 0000  vancomycin (VANCOCIN) IVPB 750 mg/150 ml premix     750 mg 150 mL/hr over 60 Minutes Intravenous  Every 12 hours 08/08/2018 1115     07/16/2018 1200  vancomycin (VANCOCIN) 1,500 mg in sodium chloride 0.9 % 500 mL IVPB     1,500 mg 250 mL/hr over 120 Minutes Intravenous  Once 07/22/2018 1113 07/26/2018 1543   07/12/2018 1115  cefTRIAXone (ROCEPHIN) 1 g in sodium chloride 0.9 % 100 mL IVPB     1 g 200 mL/hr over 30 Minutes Intravenous  Once 07/15/2018 1101 07/12/2018 1204   08/02/2018 1115  azithromycin (ZITHROMAX) 500 mg in sodium chloride 0.9 % 250 mL IVPB     500 mg 250 mL/hr over 60 Minutes Intravenous  Once 07/31/2018 1101 07/13/2018 1306        Objective:   Vitals:   08/04/18 1645 08/04/18 1700 08/04/18 1800 08/04/18 1810  BP: (!) 95/52 (!) 119/39 (!) 83/41   Pulse: (!) 114 (!) 113 (!) 114   Resp:      Temp: (!) 101.4 F (38.6 C)  99.6 F (37.6 C)  TempSrc: Axillary   Axillary  SpO2: 90% 93% 95%   Weight:      Height:        Wt Readings from Last 3 Encounters:  07/22/2018 61.5 kg  03/02/14 63.5 kg     Intake/Output Summary (Last 24 hours) at 08/04/2018 1814 Last data filed at 08/04/2018 1800 Gross per 24 hour  Intake 1727.75 ml  Output 200 ml  Net 1527.75 ml     Physical Exam Patient is examined daily including today on 08/04/18 , exams remain the same as of yesterday except that has changed   Gen:- Awake Alert, respiratory distress, increased work of breathing, unable to talk in sentences HEENT:- [place].AT, No sclera icterus Neck-Supple Neck, using accessory respiratory muscles Lungs-diminished in bases especially on the left, scattered rhonchi , tachypneic with respiratory rate close to 40 CV- S1, S2 normal, regular , tachycardic with heart rate in the 120s Abd-  +ve B.Sounds, Abd Soft, No tenderness,    Extremity:- No  edema, pedal pulses present  Psych-affect is appropriate, oriented x3 Neuro-no new focal deficits, no tremors Skin -multiple decubitus ulcers in the sacrum, left hip and right heel--- see photo below patient's sacral coccygeal decubitus ulcer with loss of  necrotic tissue is about 9 cm deep, 7 cm long and 4 cm wide   Data Review:   Micro Results Recent Results (from the past 240 hour(s))  Group A Strep by PCR     Status: None   Collection Time: 08/06/2018 10:55 AM  Result Value Ref Range Status   Group A Strep by PCR NOT DETECTED NOT DETECTED Final    Comment: Performed at [facility], [address], [place], [place] [address]  Blood Culture (routine x 2)     Status: None (Preliminary result)   Collection Time: 08/02/2018 11:17 AM  Result Value Ref Range Status   Specimen Description BLOOD LEFT WRIST  Final   Special Requests   Final    BOTTLES DRAWN AEROBIC AND ANAEROBIC Blood Culture adequate volume   Culture   Final    NO GROWTH < 24 HOURS Performed at [facility], [address], [place], [place] [address]    Report Status PENDING  Incomplete  Blood Culture (routine x 2)     Status: None (Preliminary result)   Collection Time: 08/11/2018 11:17 AM  Result Value Ref Range Status   Specimen Description BLOOD LEFT ARM  Final   Special Requests   Final    BOTTLES DRAWN AEROBIC AND ANAEROBIC Blood Culture adequate volume   Culture   Final    NO GROWTH < 24 HOURS Performed at [facility], [address] [place], [place] [address]    Report Status PENDING  Incomplete  Respiratory Panel by PCR     Status: None   Collection Time: 07/12/2018 11:22 AM  Result Value Ref Range Status   Adenovirus NOT DETECTED NOT DETECTED Final   Coronavirus 229E NOT DETECTED NOT DETECTED Final   Coronavirus HKU1 NOT DETECTED NOT DETECTED Final   Coronavirus NL63 NOT DETECTED NOT DETECTED Final   Coronavirus OC43 NOT DETECTED NOT DETECTED Final   Metapneumovirus NOT DETECTED NOT DETECTED Final   Rhinovirus / Enterovirus NOT DETECTED NOT DETECTED Final   Influenza A NOT DETECTED NOT DETECTED Final   Influenza B NOT DETECTED NOT DETECTED Final   Parainfluenza Virus 1 NOT DETECTED NOT DETECTED Final   Parainfluenza Virus 2 NOT DETECTED NOT DETECTED Final  Parainfluenza Virus 3 NOT DETECTED NOT DETECTED Final   Parainfluenza Virus 4 NOT DETECTED NOT DETECTED Final   Respiratory Syncytial Virus NOT DETECTED NOT DETECTED Final   Bordetella pertussis NOT DETECTED NOT DETECTED Final   Chlamydophila pneumoniae NOT DETECTED NOT DETECTED Final   Mycoplasma pneumoniae NOT DETECTED NOT DETECTED Final    Comment: Performed at [facility], [place] [address] [place], [place] [address]  MRSA PCR Screening     Status: None   Collection Time: 08/04/18 10:23 AM  Result Value Ref Range Status   MRSA by PCR NEGATIVE NEGATIVE Final    Comment:        The GeneXpert MRSA Assay (FDA approved for NASAL specimens only), is one component of a comprehensive MRSA colonization surveillance program. It is not intended to diagnose MRSA infection nor to guide or monitor treatment for MRSA infections. Performed at [facility], [address], [place], [place] [address]   Gram stain     Status: None   Collection Time: 08/04/18 12:10 PM  Result Value Ref Range Status   Specimen Description PLEURAL  Final   Special Requests NONE  Final   Gram Stain   Final    CYTOSPIN SMEAR WBC PRESENT, PREDOMINANTLY MONONUCLEAR NO ORGANISMS SEEN Performed at [facility], [address] [place], [place] [address]    Report Status 08/04/2018 FINAL  Final    Radiology Reports Dg Chest Port 1 View  Result Date: 08/04/2018 CLINICAL DATA:  Post LEFT thoracentesis EXAM: PORTABLE CHEST 1 VIEW COMPARISON:  Portable exam 1339 hours compared to earlier study of 09/13 hours FINDINGS: Enlargement of cardiac silhouette. Diffuse BILATERAL pulmonary infiltrates which could represent edema or infection. Improved aeration in lower LEFT lung with decreased opacification post thoracentesis. Minimal residual effusion. No pneumothorax. IMPRESSION: No pneumothorax following LEFT thoracentesis. Decreased RIGHT pleural effusion and basilar atelectasis with persistent diffuse BILATERAL pulmonary  infiltrates question edema versus infection. Electronically Signed   By: Lavonia Dana M.D.   On: 08/04/2018 13:54   Dg Chest Port 1 View  Result Date: 08/04/2018 CLINICAL DATA:  Worsening shortness of breath EXAM: PORTABLE CHEST 1 VIEW COMPARISON:  07/31/2018 FINDINGS: Mild cardiomegaly. Low lung volumes. Vascular congestion is again noted. Airspace disease towards the lung bases has increased. No pneumothorax. No definite pleural effusion. IMPRESSION: Diffuse bilateral airspace disease is worsening towards the lung bases. Electronically Signed   By: Marybelle Killings M.D.   On: 08/04/2018 09:40   Dg Chest Port 1 View  Result Date: 07/12/2018 CLINICAL DATA:  Shortness of breath and fever EXAM: PORTABLE CHEST 1 VIEW COMPARISON:  March 02, 2014 FINDINGS: There is cardiomegaly with pulmonary vascular congestion. There is interstitial edema diffusely. There is patchy airspace consolidation in the left lower lobe region. No adenopathy evident. No bone lesions. IMPRESSION: Cardiomegaly with pulmonary vascular congestion and interstitial edema. Suspect a degree of congestive heart failure. Question pneumonia versus alveolar edema in the left base. Both entities may exist concurrently. No adenopathy demonstrated. Electronically Signed   By: Lowella Grip III M.D.   On: 07/18/2018 10:50   US Thoracentesis Asp Pleural Space W/img Guide  Result Date: 08/04/2018 INDICATION: Dyspnea, respiratory failure, on BiPAP, history multiple sclerosis, infiltrates, LEFT pleural effusion by echocardiography EXAM: ULTRASOUND GUIDED DIAGNOSTIC AND THERAPEUTIC LEFT THORACENTESIS MEDICATIONS: None. COMPLICATIONS: None immediate. PROCEDURE: Procedure, benefits, and risks of procedure were discussed with patient. Written informed consent for procedure was obtained from the patient's husband, patient unable to sign. Time out protocol followed. Pleural  effusion localized by ultrasound at the posterior LEFT hemithorax. Skin prepped and  draped in usual sterile fashion. Skin and soft tissues anesthetized with 10 mL of 1% lidocaine. 8 French thoracentesis catheter placed into the LEFT pleural space. 800 mL of cloudy brown to red fluid aspirated by syringe pump. Procedure tolerated well by patient without immediate complication. FINDINGS: A total of approximately 100 mL of LEFT pleural fluid was removed. Samples were sent to the laboratory as requested by the clinical team. IMPRESSION: Successful ultrasound guided LEFT thoracentesis yielding 800 mL of pleural fluid. Electronically Signed   By: Lavonia Dana M.D.   On: 08/04/2018 14:17     CBC Recent Labs  Lab 07/16/2018 1118 08/04/18 0457  WBC 40.0* 41.7*  HGB 8.8* 9.1*  HCT 29.1* 30.3*  PLT 476* 518*  MCV 82.9 83.7  MCH 25.1* 25.1*  MCHC 30.2 30.0  RDW 14.5 14.4  LYMPHSABS 1.2  --   MONOABS 2.1*  --   EOSABS 0.0  --   BASOSABS 0.1  --     Chemistries  Recent Labs  Lab 08/04/2018 1118 08/04/18 0457  NA 139 137  K 3.7 3.6  CL 103 105  CO2 27 24  GLUCOSE 128* 120*  BUN 27* 23*  CREATININE 0.51 0.50  CALCIUM 7.4* 7.3*  AST 131*  --   ALT 96*  --   ALKPHOS 171*  --   BILITOT 0.8  --    ------------------------------------------------------------------------------------------------------------------ No results for input(s): CHOL, HDL, LDLCALC, TRIG, CHOLHDL, LDLDIRECT in the last 72 hours.  No results found for: HGBA1C ------------------------------------------------------------------------------------------------------------------ No results for input(s): TSH, T4TOTAL, T3FREE, THYROIDAB in the last 72 hours.  Invalid input(s): FREET3 ------------------------------------------------------------------------------------------------------------------ No results for input(s): VITAMINB12, FOLATE, FERRITIN, TIBC, IRON, RETICCTPCT in the last 72 hours.  Coagulation profile No results for input(s): INR, PROTIME in the last 168 hours.  No results for input(s): DDIMER  in the last 72 hours.  Cardiac Enzymes Recent Labs  Lab 07/21/2018 1120 08/07/2018 1723 07/15/2018 2334  TROPONINI 0.04* 0.03* <0.03   ------------------------------------------------------------------------------------------------------------------    Component Value Date/Time   BNP 302.0 (H) 07/12/2018 1120     Roxan Hockey M.D on 08/04/2018 at 6:14 PM  Go to [email or web address] - for contact info  [facility] - Office  [phone]

## 2018-08-04 NOTE — Progress Notes (Signed)
Transferred from 306 to ICU3 without incident.

## 2018-08-05 DIAGNOSIS — G8194 Hemiplegia, unspecified affecting left nondominant side: Secondary | ICD-10-CM

## 2018-08-05 LAB — URINE CULTURE: Culture: NO GROWTH

## 2018-08-05 LAB — BASIC METABOLIC PANEL
Anion gap: 10 (ref 5–15)
BUN: 24 mg/dL — ABNORMAL HIGH (ref 6–20)
CHLORIDE: 111 mmol/L (ref 98–111)
CO2: 20 mmol/L — AB (ref 22–32)
Calcium: 7 mg/dL — ABNORMAL LOW (ref 8.9–10.3)
Creatinine, Ser: 0.6 mg/dL (ref 0.44–1.00)
GFR calc Af Amer: >60 mL/min (ref >60)
GFR calc non Af Amer: >60 mL/min (ref >60)
Glucose, Bld: 168 mg/dL — ABNORMAL HIGH (ref 70–99)
Potassium: 3.6 mmol/L (ref 3.5–5.1)
Sodium: 141 mmol/L (ref 135–145)

## 2018-08-05 LAB — CBC
HCT: 26.3 % — ABNORMAL LOW (ref 36.0–46.0)
HEMOGLOBIN: 7.8 g/dL — AB (ref 12.0–15.0)
MCH: 25.5 pg — ABNORMAL LOW (ref 26.0–34.0)
MCHC: 29.7 g/dL — AB (ref 30.0–36.0)
MCV: 85.9 fL (ref 80.0–100.0)
Platelets: 474 10*3/uL — ABNORMAL HIGH (ref 150–400)
RBC: 3.06 MIL/uL — ABNORMAL LOW (ref 3.87–5.11)
RDW: 14.8 % (ref 11.5–15.5)
WBC: 45.7 10*3/uL — ABNORMAL HIGH (ref 4.0–10.5)
nRBC: 0.1 % (ref 0.0–0.2)

## 2018-08-05 MED ORDER — LORAZEPAM 1 MG PO TABS: 1.0000 mg | ORAL_TABLET | ORAL | Status: DC | PRN

## 2018-08-05 MED ORDER — MORPHINE SULFATE (PF) 2 MG/ML IV SOLN
INTRAVENOUS | Status: AC
  Administered 2018-08-05: 2 mg via INTRAVENOUS
  Filled 2018-08-05: qty 1

## 2018-08-05 MED ORDER — BIOTENE DRY MOUTH MT LIQD: 15.0000 mL | OROMUCOSAL | Status: DC | PRN

## 2018-08-05 MED ORDER — SODIUM CHLORIDE 0.9% FLUSH
3.0000 mL | Freq: Two times a day (BID) | INTRAVENOUS | Status: DC
  Administered 2018-08-05: 3 mL via INTRAVENOUS

## 2018-08-05 MED ORDER — SODIUM CHLORIDE 0.9 % IV SOLN: 250.0000 mL | INTRAVENOUS | Status: DC | PRN

## 2018-08-05 MED ORDER — POLYVINYL ALCOHOL 1.4 % OP SOLN: 1.0000 [drp] | Freq: Four times a day (QID) | OPHTHALMIC | Status: DC | PRN

## 2018-08-05 MED ORDER — ACETAMINOPHEN 650 MG RE SUPP: 650.0000 mg | Freq: Four times a day (QID) | RECTAL | Status: DC | PRN

## 2018-08-05 MED ORDER — LORAZEPAM 2 MG/ML IJ SOLN: 1.0000 mg | INTRAMUSCULAR | Status: DC | PRN

## 2018-08-05 MED ORDER — ACETAMINOPHEN 325 MG PO TABS: 650.0000 mg | ORAL_TABLET | Freq: Four times a day (QID) | ORAL | Status: DC | PRN

## 2018-08-05 MED ORDER — TEMAZEPAM 7.5 MG PO CAPS: 7.5000 mg | ORAL_CAPSULE | Freq: Every evening | ORAL | Status: DC | PRN

## 2018-08-05 MED ORDER — LORAZEPAM 2 MG/ML IJ SOLN
1.0000 mg | INTRAMUSCULAR | Status: DC | PRN
  Administered 2018-08-05: 1 mg via INTRAVENOUS
  Filled 2018-08-05: qty 1

## 2018-08-05 MED ORDER — DEXTROSE-NACL 5-0.45 % IV SOLN: INTRAVENOUS | Status: DC

## 2018-08-05 MED ORDER — ORAL CARE MOUTH RINSE: 15.0000 mL | Freq: Two times a day (BID) | OROMUCOSAL | Status: DC

## 2018-08-05 MED ORDER — MORPHINE SULFATE (PF) 2 MG/ML IV SOLN
1.0000 mg | INTRAVENOUS | Status: DC | PRN
  Administered 2018-08-05 (×3): 1 mg via INTRAVENOUS
  Filled 2018-08-05 (×3): qty 1

## 2018-08-05 MED ORDER — LIDOCAINE VISCOUS HCL 2 % MT SOLN: 15.0000 mL | Freq: Four times a day (QID) | OROMUCOSAL | Status: DC | PRN

## 2018-08-05 MED ORDER — MORPHINE SULFATE (CONCENTRATE) 10 MG/0.5ML PO SOLN: 5.0000 mg | ORAL | Status: DC | PRN

## 2018-08-05 MED ORDER — SODIUM CHLORIDE 0.9% FLUSH: 3.0000 mL | INTRAVENOUS | Status: DC | PRN

## 2018-08-05 MED ORDER — MAGIC MOUTHWASH: 5.0000 mL | Freq: Four times a day (QID) | ORAL | Status: DC | PRN

## 2018-08-05 MED ORDER — MORPHINE SULFATE (PF) 2 MG/ML IV SOLN: 2.0000 mg | Freq: Once | INTRAVENOUS | Status: AC

## 2018-08-05 MED ORDER — ATROPINE SULFATE 1 % OP SOLN: 4.0000 [drp] | OPHTHALMIC | Status: DC | PRN

## 2018-08-05 MED ORDER — LORAZEPAM 2 MG/ML IJ SOLN
0.5000 mg | Freq: Four times a day (QID) | INTRAMUSCULAR | Status: DC | PRN
  Administered 2018-08-05: 0.5 mg via INTRAVENOUS
  Filled 2018-08-05: qty 1

## 2018-08-05 MED ORDER — MAGIC MOUTHWASH W/LIDOCAINE: 15.0000 mL | Freq: Four times a day (QID) | ORAL | Status: DC | PRN

## 2018-08-05 MED ORDER — ONDANSETRON 4 MG PO TBDP: 4.0000 mg | ORAL_TABLET | Freq: Four times a day (QID) | ORAL | Status: DC | PRN

## 2018-08-05 MED ORDER — CHLORHEXIDINE GLUCONATE 0.12 % MT SOLN
15.0000 mL | Freq: Two times a day (BID) | OROMUCOSAL | Status: DC
  Administered 2018-08-05: 15 mL via OROMUCOSAL

## 2018-08-05 MED ORDER — COLLAGENASE 250 UNIT/GM EX OINT
TOPICAL_OINTMENT | Freq: Two times a day (BID) | CUTANEOUS | Status: DC
  Filled 2018-08-05: qty 30

## 2018-08-05 MED ORDER — OLANZAPINE 5 MG PO TBDP
5.0000 mg | ORAL_TABLET | Freq: Every day | ORAL | Status: DC
  Filled 2018-08-05 (×3): qty 1

## 2018-08-05 MED ORDER — LORAZEPAM 2 MG/ML PO CONC: 1.0000 mg | ORAL | Status: DC | PRN

## 2018-08-05 MED ORDER — ONDANSETRON HCL 4 MG/2ML IJ SOLN: 4.0000 mg | Freq: Four times a day (QID) | INTRAMUSCULAR | Status: DC | PRN

## 2018-08-05 MED ORDER — BISACODYL 10 MG RE SUPP: 10.0000 mg | Freq: Every day | RECTAL | Status: DC | PRN

## 2018-08-05 MED ORDER — SODIUM CHLORIDE 0.9 % IV BOLUS
500.0000 mL | Freq: Once | INTRAVENOUS | Status: AC
  Administered 2018-08-05: 500 mL via INTRAVENOUS

## 2018-08-08 LAB — CULTURE, BLOOD (ROUTINE X 2)
Culture: NO GROWTH
Culture: NO GROWTH
SPECIAL REQUESTS: ADEQUATE
Special Requests: ADEQUATE

## 2018-08-09 LAB — CULTURE, BODY FLUID W GRAM STAIN -BOTTLE

## 2018-08-09 LAB — CULTURE, BODY FLUID-BOTTLE

## 2018-08-11 ENCOUNTER — Telehealth: Payer: Self-pay | Admitting: Cardiology

## 2018-08-12 NOTE — Progress Notes (Signed)
Spoke with Erasmo Score with [facility] who states pt will not be a donor candidate.  Reference number: [id number]

## 2018-08-12 NOTE — Progress Notes (Addendum)
Pt has stated to staff and family members, who are present in room, that she wants the bipap off and would like to die. Pt is alert and oriented x3. Dr. Mariea Clonts has been notified.

## 2018-08-12 NOTE — Progress Notes (Signed)
Fr. Opyd notified of B/P 85/41 MAP 55. HR 97, RR 20, Sp02 95

## 2018-08-12 NOTE — Progress Notes (Signed)
[facility]  Patient comfort care. Expressed condolences to family and offered to change dressing. They want patient to rest now as she is comfortable. I completely understand.  Would do changes as needed with turns.   Algis Greenhouse, MD [facility] [address] [place], [place] [address] [phone] (office)

## 2018-08-12 NOTE — Progress Notes (Signed)
Spoke with Campbell Stall at [employer] (backup line for [place] donor services. Will be receiving a call back from [facility] regarding whether pt is a candidate.  Reference number: [id number]

## 2018-08-12 NOTE — Progress Notes (Addendum)
Patients oxygen has been increased during night back to 80, patient complained once of being short of breath and panic around 4 am. Suspect she is third spacing fluid as her albumin is low. 1.7 -- she also is about 3674 Positive with very little out put. She also has been given ativan for anxiety. Blood pressure has been trending low and she has received more fluid.

## 2018-08-12 NOTE — Progress Notes (Signed)
Dr. Mariea Clonts in with patient and family members. Pt taken off of bipap and placed on 15 L HFNC to allow family to communicate regarding comfort care. Pt desaturated to 80% on 15 L HFNC. Waiting for family's decision as to whether to place bipap back on or not.

## 2018-08-12 NOTE — Progress Notes (Signed)
Dr. Antionette Char notified of B/P 75/38. MAP 50 HR 94.

## 2018-08-12 NOTE — Discharge Summary (Signed)
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 female history of debilitating multiple sclerosis, wheelchair-bound at baseline, history of prior stroke in 2008 with left-sided hemiparesis as well as decubitus ulcers who presents to the ED with on and off fevers for about 4 days  Upon entering patient's room in the ED there is a strong foul smelling odor--retrospect this is from a decubitus backslash with lots of necrotic tissue  Additional history obtained from patient's husband and daughter-in-law at bedside  Patient did have some shortness of breath, but no chest pains, no vomiting or diarrhea  In ED--- EKG was found to be abnormal---Abnormal EKG - EKG consistent with pericarditis. Diffuse upsloping ST elevations with diffuse PR depression, PR elevatoin aVR, no reciprical ST/T changes. ST is isoelectric to TP segment. Troponin is only 0.04, again patient is chest pain-free  EKG was reviewed by on-call cardiologist Dr. Harl Bowie  In ED--chest x-ray suggestive of pneumonia,  patient is found to have leukocytosis of 40 K, group A strep was negative, ESR is 83 and lactic acid is only 1.3  Patient received IV antibiotics, surgical consult from Dr. Constance Haw appreciated, patient underwent bedside wound debridement in the ED  Hospital Course:  1)Sepsis secondary to infected decubitus ulcers with  concomitant Pneumonia----white count 45.7 K and increasing despite aggressive antibiotic therapy and left-sided thoracentesis, pleural fluid culture growing gram-negative rods, ESR 83,pt has worsening tachycardia and tachypnea, hypoxia and increased work of breathing persisted, patient was on continuous BiPAP, treated with IV Vanco, and IV Zosyn,  stopped IV Rocephin--- patient orders on continuous BiPAP for over 16 hours--- blood pressures remained soft, patient remains tachycardic, patient continued to be tachypneic, leukocytosis was trending up, patient, his son and husband had a conference with myself and RN Grayland Ormond..... They requested comfort measures only without further blood draws or aggressive treatment protocols----BiPAP was removed before 10 AM on 08-21-2018  at the request of patient and family, comfort care measures were instituted, patient expired with family at bedside at 3:53 PM  On 21-Aug-2018    2)Infected Decubitus Ulcers--patient's sacral coccygeal decubitus ulcer with lots of necrotic tissue is about 9 cm deep, 7 cm long and 4 cm wide(see photos )--- statuspost bedside debridement by general surgeon Dr. Constance Haw on 08-19-2018,  rated with IV antibiotics as above #1, ESR is 83, suspect/presume osteomyelitis,patient also has left hip and right heel decubitus ulcers--patient and family requested comfort measures only, BiPAP was removed before 10 AM on August 21, 2018  at the request of patient and family, comfort care measures were instituted, patient expired with family at bedside at 3:53 PM  On 08-21-2018    3) pneumonia with left-sided parapneumonic effusion-status post ultrasound-guided thoracentesis on 08/04/2018 with removal of 800 mL of brown to reddish fluid, culture growing gram-negative rod,,  treated with IV antibiotics as above #1, BiPAP was removed before 10 AM on 21-Aug-2018  at the request of patient and family, comfort  care measures were instituted, patient expired with family at bedside at  3:53 PM  On 08-17-18  4)Social/Ethics--plan of care discussed with husband and son at bedside,patient , her son and husband request DNR/DNI status, -blood pressures remained soft, patient remains tachycardic, patient continued to be tachypneic, leukocytosis was trending up, patient, his son and husband had a conference with myself and RN Grayland Ormond..... They requested comfort measures only without further blood draws or aggressive treatment protocols----BiPAP was removed before 10 AM on 08/17/18  at the request of patient and family, comfort care measures were instituted, patient expired with family at bedside at 3:53 PM  On 08-17-2018   5)Pericarditis--- Ptwithout chest pains, continues to have fevers and elevated ESR noted as above #1 abnormal EKG noted, cardiology consult from Dr. Harl Bowie appreciated, treated with ibuprofen 600 mg 3 times daily and colchicine 0.6 mg daily,echocardiogram with preserved EF of 60 to 65% withmildpericardial effusion,no wall motion normalities,--please see EKG description below - EKG consistent with pericarditis. Diffuse upsloping ST elevations with diffuse PR depression, PR elevatoin aVR, no reciprocal ST/T changes. ST is isoelectric to TP segment-  blood pressures remained soft, patient remains tachycardic, patient continued to be tachypneic, leukocytosis was trending up, patient, his son and husband had a conference with myself and RN Grayland Ormond..... They requested comfort measures only without further blood draws or aggressive treatment protocols----BiPAP was removed before 10 AM on 08/17/18  at the request of patient and family, comfort care measures were instituted, patient expired with family at bedside at 3:53 PM  On 08-17-2018   6)chronic pain syndrome---patient has implanted baclofen pump  7)multiple sclerosis---at baseline patient is wheelchair-bound,patient is total care  8)history of prior stroke in 2008 with left-sided hemiparesis---comfort measures,  patient expired peacefully with family at bedside   Code Status : DNR  Family Communication:   Husband and son at bedside--- blood pressures remained soft, patient remains tachycardic, patient continued to be tachypneic, leukocytosis was trending up, patient, his son and husband had a conference with myself and RN Grayland Ormond..... They requested comfort measures only without further blood draws or aggressive treatment protocols----BiPAP was removed before 10 AM on August 17, 2018  at the request of patient and family, comfort care measures were instituted, patient expired with family at bedside at 3:53 PM  On Aug 17, 2018    Disposition Plan  : expired  Consults  :  IR for left-sided ultrasound-guided thoracentesis/general surgery for debridement  DVT Prophylaxis  :   SCDs    Date and Time: of DEATH  08/17/18 at 95  Signed:  [facility] 2018-08-17, 5:15 PM

## 2018-08-12 DEATH — deceased

## 2020-05-10 IMAGING — CR DG CHEST 1V PORT
1 series · 1 of 1 positions shown · non-contrast
Comparison: March 02, 2014

CLINICAL DATA: Shortness of breath and fever

EXAM:
PORTABLE CHEST 1 VIEW

[portable]
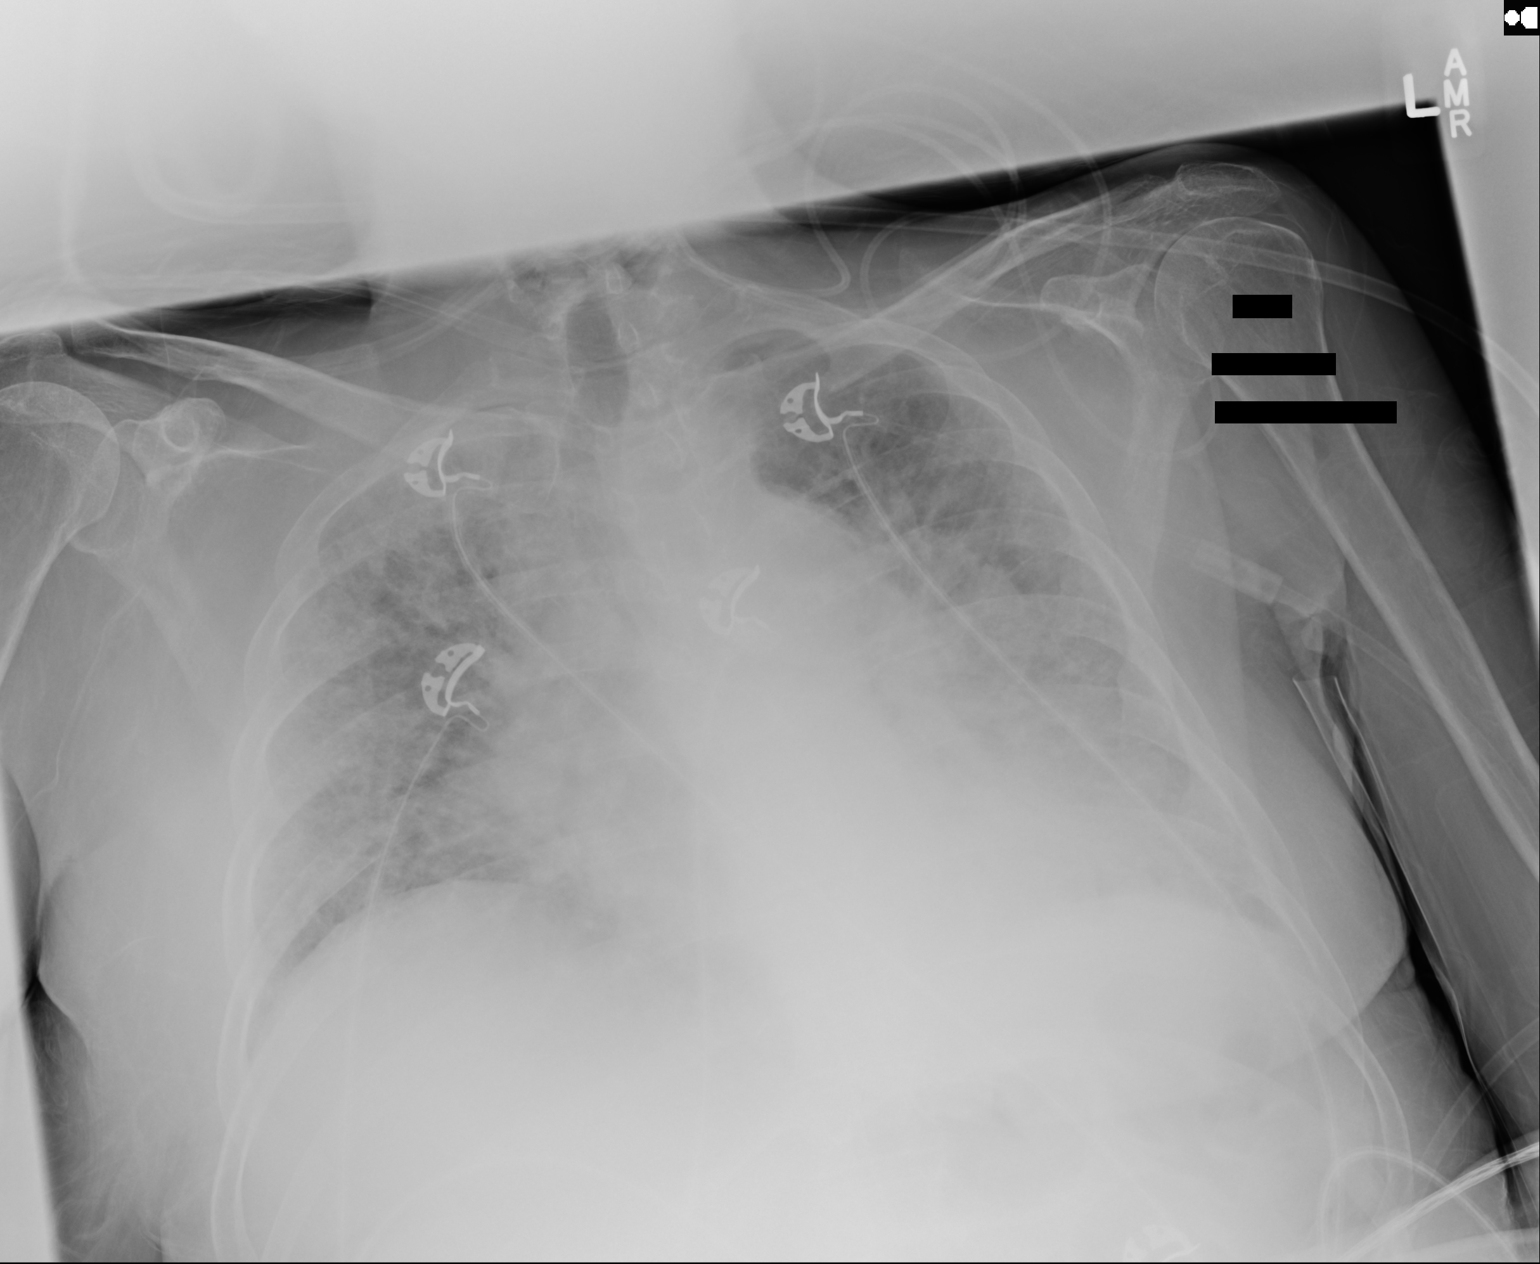

[1 of 1 positions shown; findings below may reference images not displayed]

FINDINGS: There is cardiomegaly with pulmonary vascular congestion. There is
interstitial edema diffusely. There is patchy airspace consolidation
in the left lower lobe region. No adenopathy evident. No bone
lesions.
IMPRESSION: Cardiomegaly with pulmonary vascular congestion and interstitial
edema. Suspect a degree of congestive heart failure. Question
pneumonia versus alveolar edema in the left base. Both entities may
exist concurrently. No adenopathy demonstrated.

## 2020-05-11 IMAGING — US US THORACENTESIS ASP PLEURAL SPACE W/IMG GUIDE
1 series · 7 of 7 positions shown · non-contrast
Comparison: none

INDICATION: Dyspnea, respiratory failure, on BiPAP, history multiple sclerosis,
infiltrates, LEFT pleural effusion by echocardiography

[Series 1: us thoracentesis asp pleural space w/img guide · 0.25mm/px · 7 of 7 slices shown]
[im 1/7]
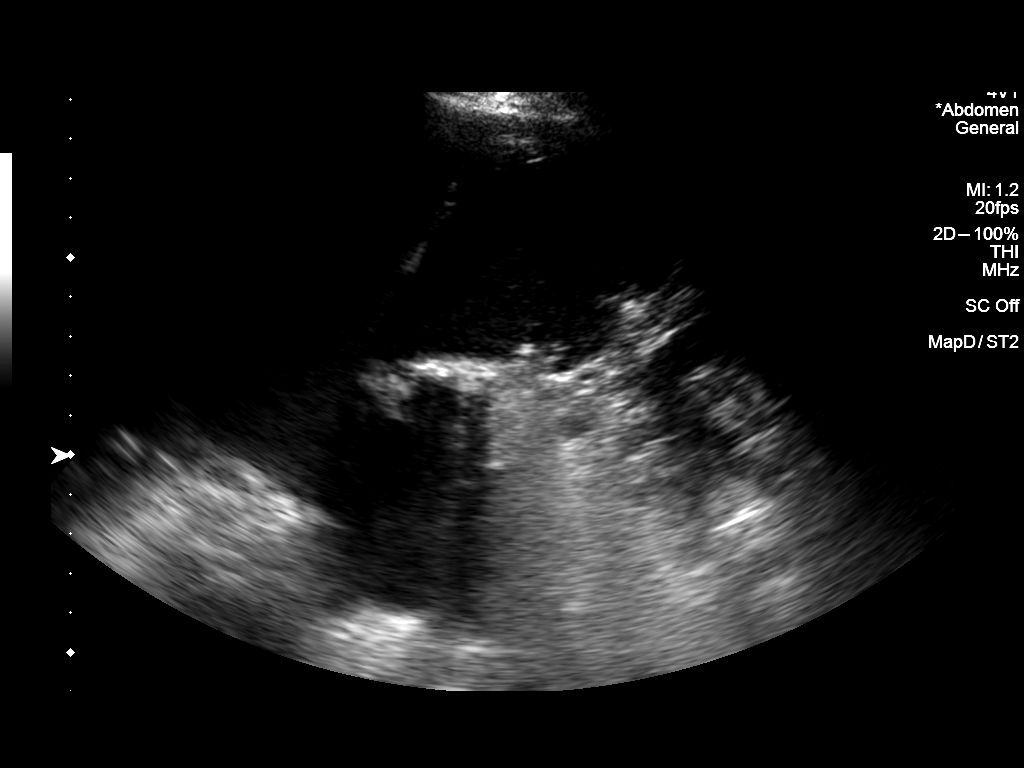
[im 2/7]
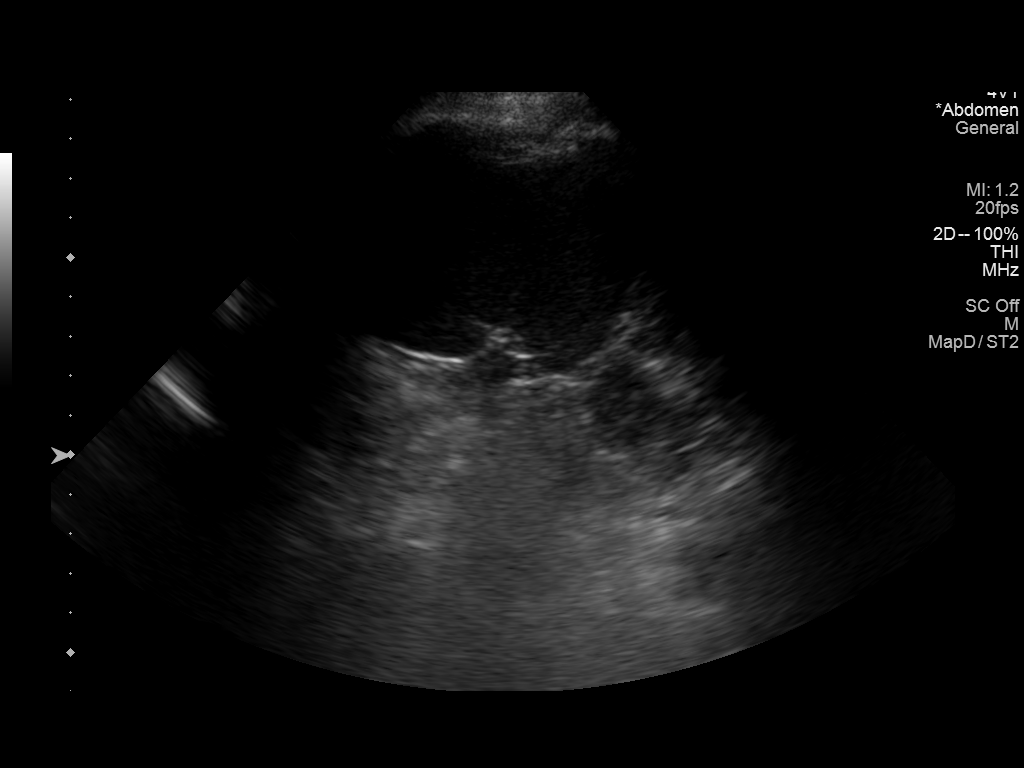
[im 3/7]
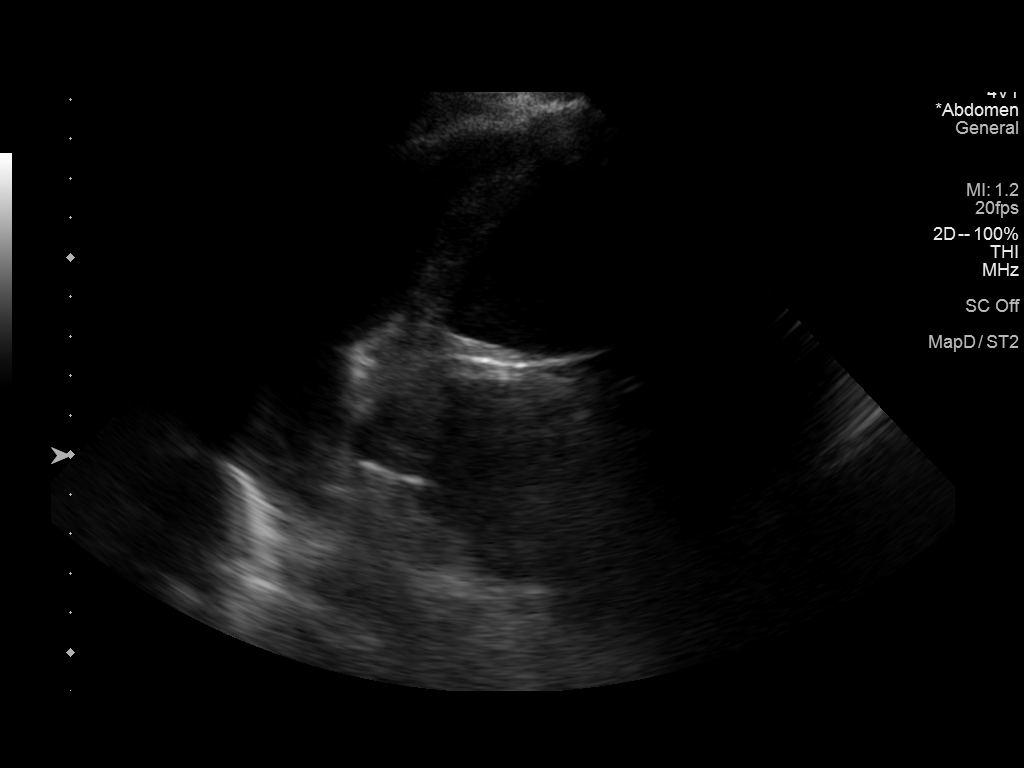
[im 4/7]
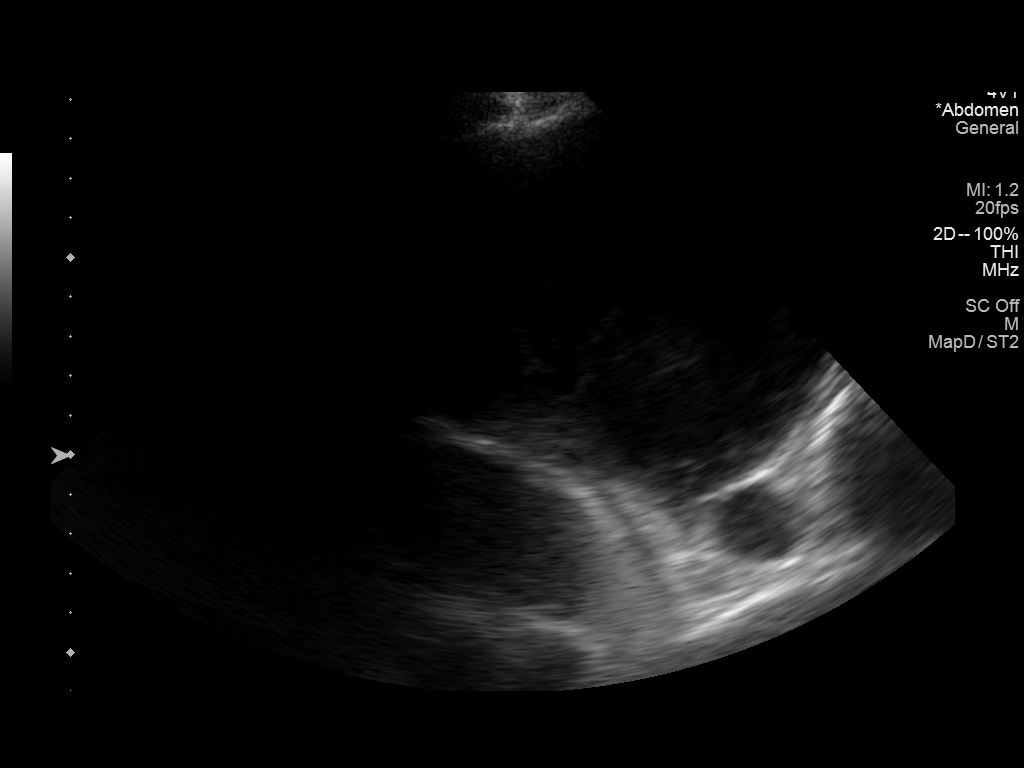
[im 5/7]
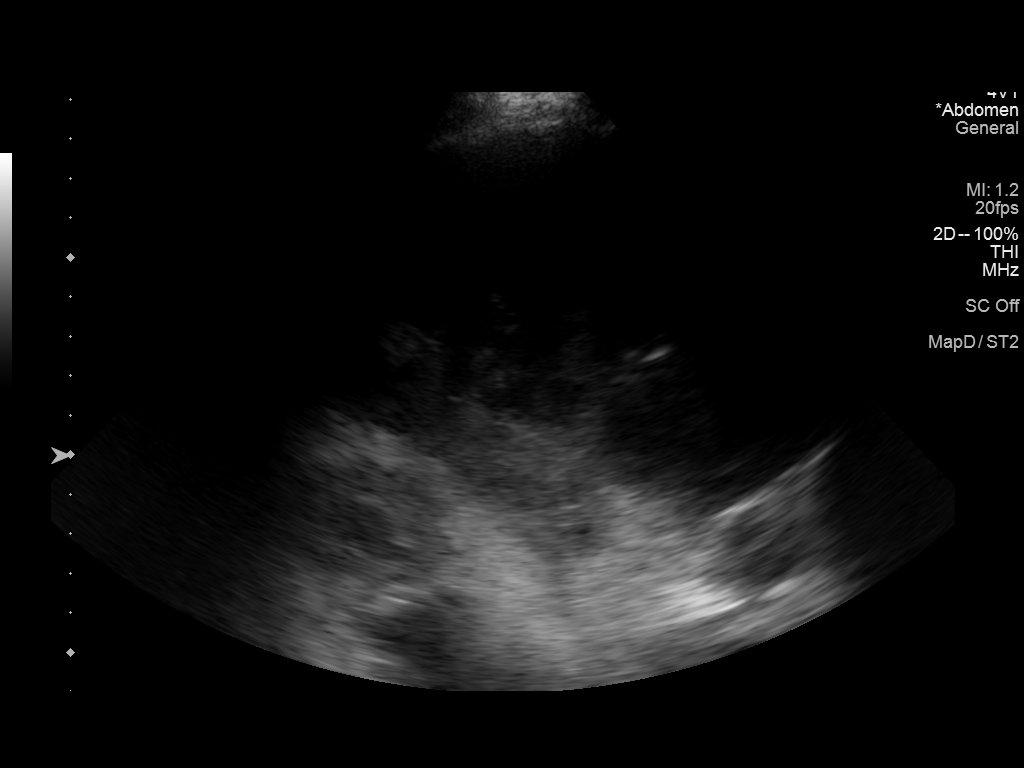
[im 6/7]
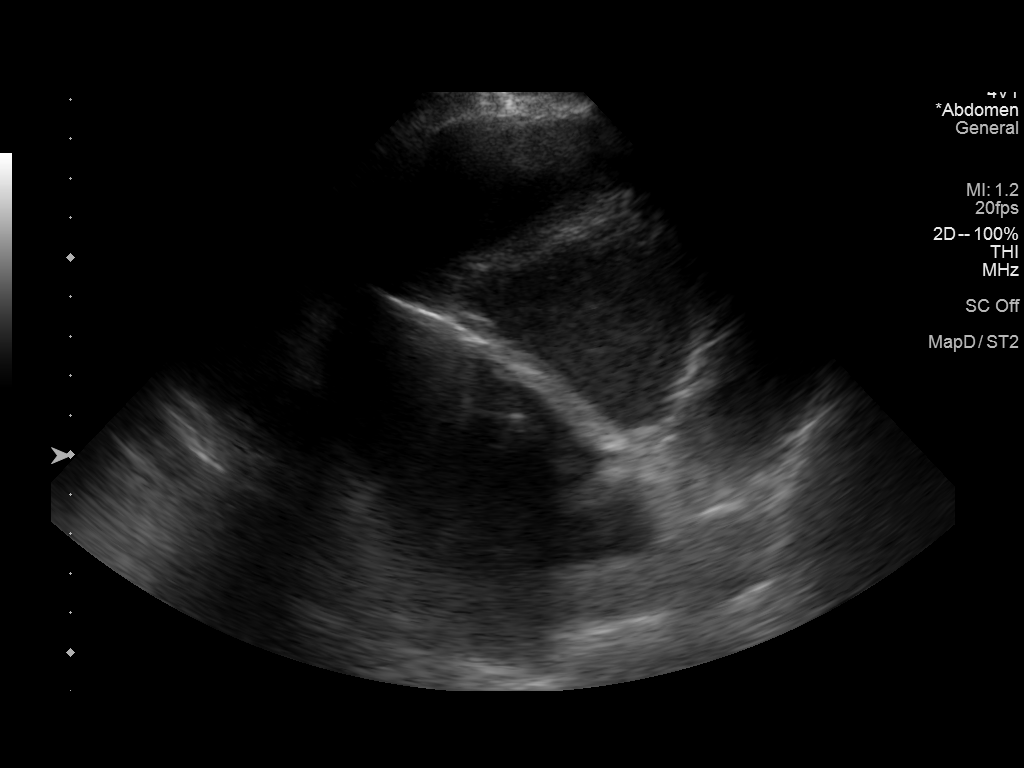
[im 7/7]
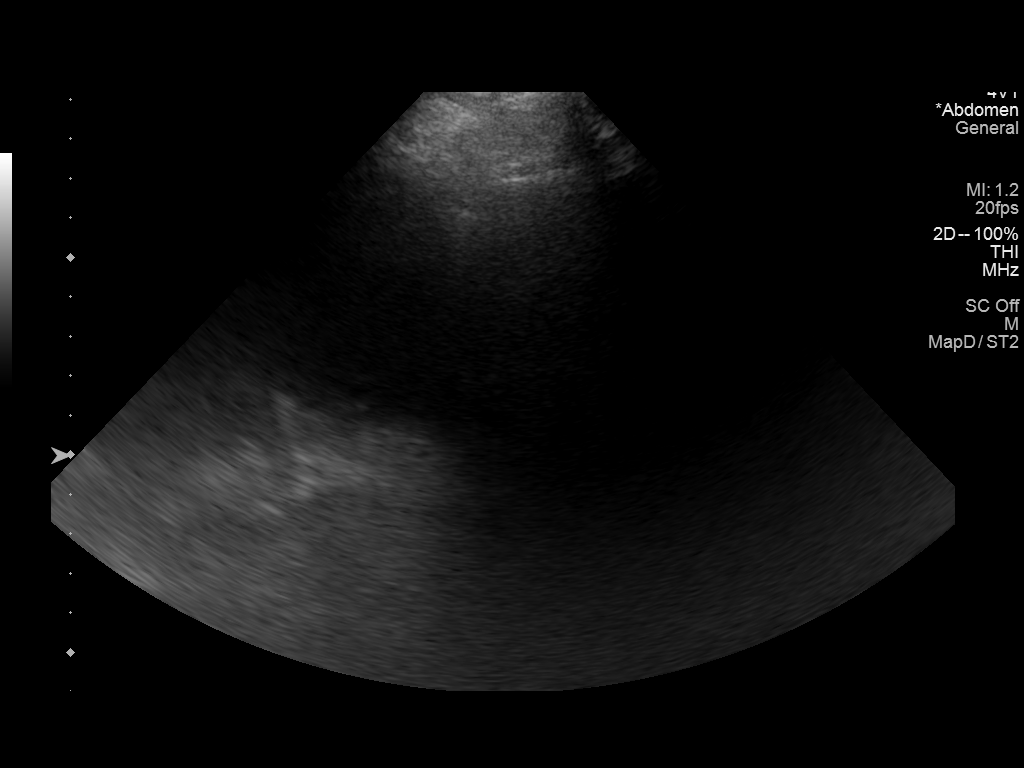

[7 of 7 positions shown; findings below may reference images not displayed]

EXAM:
ULTRASOUND GUIDED DIAGNOSTIC AND THERAPEUTIC LEFT THORACENTESIS

MEDICATIONS:
None.

COMPLICATIONS:
None immediate.

PROCEDURE:
Procedure, benefits, and risks of procedure were discussed with
patient.

Written informed consent for procedure was obtained from the
patient's husband, patient unable to sign.

Time out protocol followed.

Pleural effusion localized by ultrasound at the posterior LEFT
hemithorax.

Skin prepped and draped in usual sterile fashion.

Skin and soft tissues anesthetized with 10 mL of 1% lidocaine.

8 French thoracentesis catheter placed into the LEFT pleural space.

800 mL of cloudy brown to red fluid aspirated by syringe pump.

Procedure tolerated well by patient without immediate complication.
FINDINGS: A total of approximately 100 mL of LEFT pleural fluid was removed.
Samples were sent to the laboratory as requested by the clinical
team.
IMPRESSION: Successful ultrasound guided LEFT thoracentesis yielding 800 mL of
pleural fluid.
